# Patient Record
Sex: Female | Born: 1980 | Race: Black or African American | Hispanic: No | Marital: Single | Smoking: Former smoker
Health system: Southern US, Community
[De-identification: ages and names within clinical notes are randomized; demographics above are authoritative.]

## PROBLEM LIST (undated history)

## (undated) DIAGNOSIS — I10 Essential (primary) hypertension: Secondary | ICD-10-CM

## (undated) DIAGNOSIS — Z8742 Personal history of other diseases of the female genital tract: Secondary | ICD-10-CM

## (undated) DIAGNOSIS — Z9884 Bariatric surgery status: Secondary | ICD-10-CM

## (undated) DIAGNOSIS — Z8639 Personal history of other endocrine, nutritional and metabolic disease: Secondary | ICD-10-CM

## (undated) DIAGNOSIS — E669 Obesity, unspecified: Secondary | ICD-10-CM

## (undated) DIAGNOSIS — K859 Acute pancreatitis without necrosis or infection, unspecified: Secondary | ICD-10-CM

## (undated) HISTORY — PX: ROUX-EN-Y GASTRIC BYPASS: SHX1104

## (undated) HISTORY — DX: Obesity, unspecified: E66.9

## (undated) HISTORY — DX: Bariatric surgery status: Z98.84

## (undated) HISTORY — DX: Personal history of other diseases of the female genital tract: Z87.42

## (undated) HISTORY — PX: CERVICAL CONE BIOPSY: SUR198

## (undated) HISTORY — DX: Essential (primary) hypertension: I10

## (undated) HISTORY — DX: Personal history of other endocrine, nutritional and metabolic disease: Z86.39

---

## 2018-09-19 ENCOUNTER — Telehealth: Payer: Self-pay | Admitting: General Surgery

## 2018-09-19 NOTE — Telephone Encounter (Signed)
Left a voicemail for the patient to contact the office to pre-screen for virtual visit 09/20/2018

## 2018-09-19 NOTE — Progress Notes (Signed)
TELEHEALTH VISIT  Referring Provider: Wallis BambergMani, Mario, PA-C Primary Care Physician:  No primary care provider on file.   Tele-visit due to COVID-19 pandemic Patient requested visit virtually, consented to the virtual encounter via video enabled telemedicine application (Doximity) Contact made at: 10:00 09/20/18 Patient verified by name and date of birth Location of patient: Home Location provider: [place] [facility] Names of persons participating: Me, patient Time spent on telehealth visit: 26 minutes I discussed the limitations of evaluation and management by telemedicine. The patient expressed understanding and agreed to proceed.  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 Both upper (such as marginal ulcers following Roux-en-Y, gastritis, peptic ulcer disease, and esophagitis given her recent atypical chest pain) and lower sources (outlet sources such as fissure or hemorrhoids, as well as polyps, mass, ulcers, and colitis). Given this differential I am recommending a colonoscopy and upper endoscopy.  She has not been taking any of her post Roux-en-Y recommended supplements.  It is common for patients after a Roux-en-Y to need iron, calcium, B12, folate as well as copper, thiamine, and zinc.  I also recommend use of vitamin A, vitamin D, vitamin K, and regular calcium.  However, my best recommendation for Laura Hunter would be to review the specific recommendations from her bariatric surgery team at [facility].   PLAN: Continue famotidine Abstinence from alcohol recommended Resume micronutrient supplements as recommended by the bariatric surgeon EGD and colonoscopy recommended, but she would only like to  proceed with colonoscopy at this time  I consented the patient discussing the risks, benefits, and alternatives to endoscopic evaluation. In particular, we discussed the risks that include, but are not limited to, reaction to medication, cardiopulmonary compromise, bleeding requiring blood transfusion, aspiration resulting in pneumonia, perforation requiring surgery, lack of diagnosis, severe illness requiring hospitalization, and even death. We reviewed the risk of missed lesion including polyps or even cancer. The patient acknowledges these risks and asks that we proceed.  HPI: Laura Hunter is a 38 y.o. [employer] customer [employer] referred byPA Wallis BambergMario Mani for blood in the stool. She has a history of hypertension, abnormal Pap smear in 2011, diabetes mellitus improved after [facility]ariatric surgery in 2012 [facility], low back pain, hyperlipidemia. The history is obtained through the patient and review of her electronic health record including CareEverywhere. Seen by [facility] 09/04/18 which prompted this visit.   Roux-en-Y at bariatric surgery 2012. Takes a woman's daily with iron and occasionally biotin.   Initially presented to the Urgent Care for evaluation of atypical, constant, sharp, chest pain/pressure over the midchest that she thinks may be due to indigestion.  No heartburn, dysphagia, odynophagia, or dysphonia.  Also noted to the PA during the time of her visit that she was having back pain, had been off her hypertension medications, and blood in the stool. Noted drinking at least 4-5 shots of liquor daily. PA Mani recommended that she see GI.  She has plans to return there for follow-up and labs tomorrow. Unfortunately, they were unable to draw labs at the time of her prior visit.  Given Pepcid. Abstinence from alcohol recommended.  The pharmacist recommended OTC Pepcid when she went to have this filled.  The box noted that it was to be used as needed  and she is only used it once or twice since  her visit in the urgent care.  Sees blood in the stool, on the paper, and in the toilet bowl for a few days every couple of months. Primarily occurs after a binge of drinking over the last 5-6 months.  Normal bowel habits between those episodes.  No melena. No abdominal pain, nausea, or vomiting. No rectal pain.  No recent change in bowel habits.  No diarrhea or constipation.    No prior endoscopic evaluation.  No prior abdominal imaging.  No known family history of colon cancer or polyps. No family history of uterine/endometrial cancer, pancreatic cancer or gastric/stomach cancer.  Past Medical History:  Diagnosis Date   Obesity     Past Surgical History:  Procedure Laterality Date   CESAREAN SECTION     ROUX-EN-Y GASTRIC BYPASS      Current Outpatient Medications  Medication Sig Dispense Refill   amLODipine (NORVASC) 5 MG tablet TK 1 T PO  D     cyclobenzaprine (FLEXERIL) 5 MG tablet TK 1 T PO  TID PRN     famotidine (PEPCID) 20 MG tablet famotidine 20 mg tablet  Take 1 tablet twice a day by oral route.     hydrochlorothiazide (HYDRODIURIL) 12.5 MG tablet TK 1 T PO  D     No current facility-administered medications for this visit.     Allergies as of 09/20/2018 - Review Complete 09/20/2018  Allergen Reaction Noted   Lisinopril Other (See Comments) 03/08/2013    Family History  Problem Relation Age of Onset   HIV/AIDS Mother    HIV/AIDS Father    Diabetes Brother     Social History   Socioeconomic History   Marital status: Not on file    Spouse name: Not on file   Number of children: Not on file   Years of education: Not on file   Highest education level: Not on file  Occupational History   Not on file  Social Needs   Financial resource strain: Not on file   Food insecurity    Worry: Not on file    Inability: Not on file   Transportation needs    Medical: Not on file    Non-medical: Not on file  Tobacco Use   Smoking status: Former  Smoker   Smokeless tobacco: Never Used  Substance and Sexual Activity   Alcohol use: Yes    Alcohol/week: 6.0 standard drinks    Types: 6 Shots of liquor per week    Comment: 3 to 6 shots daily of Bacardi   Drug use: Never   Sexual activity: Yes    Partners: Male    Birth control/protection: I.U.D.  Lifestyle   Physical activity    Days per week: Not on file    Minutes per session: Not on file   Stress: Not on file  Relationships   Social connections    Talks on phone: Not on file    Gets together: Not on file    Attends religious service: Not on file    Active member of club or organization: Not on file    Attends meetings of clubs or organizations: Not on file    Relationship status: Not on file   Intimate partner violence    Fear of current or ex partner: Not on file    Emotionally abused: Not on file    Physically abused: Not on file    Forced sexual activity: Not on  file  Other Topics Concern   Not on file  Social History Narrative   Not on file    Review of Systems: ALL ROS discussed and all others negative except listed in HPI.  Physical Exam: Complete physical exam not performed due to the limits inherent in a telehealth encounter.  General: Awake, alert, and oriented, and well communicative. In no acute distress.  HEENT: EOMI, non-icteric sclera, NCAT, MMM  Neck: Normal movement of head and neck  Pulm: No labored breathing, speaking in full sentences without conversational dyspnea  Derm: No apparent lesions or bruising in visible field  MS: Moves all visible extremities without noticeable abnormality  Psych: Pleasant, cooperative, normal speech, normal affect and normal insight Neuro: Alert and appropriate   Ahmed Inniss L. Orvan FalconerBeavers, MD, MPH [facility] 09/20/2018, 10:13 AM

## 2018-09-20 ENCOUNTER — Encounter: Payer: Self-pay | Admitting: Gastroenterology

## 2018-09-20 ENCOUNTER — Telehealth: Payer: Self-pay | Admitting: General Surgery

## 2018-09-20 ENCOUNTER — Ambulatory Visit (INDEPENDENT_AMBULATORY_CARE_PROVIDER_SITE_OTHER): Payer: BLUE CROSS/BLUE SHIELD | Admitting: Gastroenterology

## 2018-09-20 VITALS — Ht 62.0 in | Wt 205.0 lb

## 2018-09-20 DIAGNOSIS — K921 Melena: Secondary | ICD-10-CM

## 2018-09-20 MED ORDER — NA SULFATE-K SULFATE-MG SULF 17.5-3.13-1.6 GM/177ML PO SOLN: 1.0000 | Freq: Once | ORAL | 0 refills | Status: AC

## 2018-09-20 NOTE — Patient Instructions (Addendum)
Recommended a colonoscopy and upper endoscopy for further evaluation of the blood in your stool.  At your request we will proceed with the colonoscopy first.  However, if the colonoscopy is negative and upper endoscopy will be needed.  An upper endoscopy also allows [place] to evaluate your recent chest pain.  You have been scheduled for a colonoscopy. Please follow written instructions given to you at your visit today.  Please pick up your prep supplies at the pharmacy within the next 1-3 days.  If you use inhalers (even only as needed), please bring them with you on the day of your procedure.   Please resume all supplements as recommended by your bariatric surgery team.  You mentioned you will have blood drawn through the urgent care later this week.  Please asked them to send [place] a copy of those results when they are available.  Abstinence from alcohol will be important for your long-term health.  Alcohol may be contributing to your GI bleeding.  Thank you for your patience with me and our technology today! Please stay home, safe, and healthy. I look forward to meeting you in person in the future.

## 2018-09-20 NOTE — Telephone Encounter (Signed)
Tried to contact patient to pre-screen for virtual visit. Phone went straight to voicemail left a message

## 2018-10-26 ENCOUNTER — Telehealth: Payer: Self-pay | Admitting: Gastroenterology

## 2018-10-26 NOTE — Telephone Encounter (Signed)
Noted! Thank you

## 2018-10-26 NOTE — Telephone Encounter (Signed)
Pt canceled her appt from Monday until 12/07/2018 because she is having plumbing problems and problems with her house

## 2018-10-29 ENCOUNTER — Encounter: Payer: BLUE CROSS/BLUE SHIELD | Admitting: Gastroenterology

## 2018-12-06 ENCOUNTER — Telehealth: Payer: Self-pay

## 2018-12-06 ENCOUNTER — Encounter: Payer: Self-pay | Admitting: Gastroenterology

## 2018-12-06 NOTE — Telephone Encounter (Signed)
Good morning Dr. Tarri Glenn I was calling this patient for covid screening, when she informed me she had cancelled the procedure. Looks like she r/s back in August, but states she was calling to cancel not reschedule. Would you like to charge for late cancellation?

## 2018-12-06 NOTE — Telephone Encounter (Signed)
Patient states she has not had any more rectal bleeding since she scheduled her procedures and she feels fine. She declined to rescheduled. Will call back to follow up as needed.

## 2018-12-06 NOTE — Telephone Encounter (Signed)
I strongly recommend colonoscopy even though she has had not further bleeding. There is the risk of not diagnosis colon polyp or colon cancer as early as possible. Thank you.

## 2018-12-06 NOTE — Telephone Encounter (Signed)
Desiree, please call the patient. I am very concerned about her not having a colonoscopy because of her rectal bleeding. Please find out why she doesn't wish to proceed and offer her a follow-up office visit. Thank you.

## 2018-12-07 ENCOUNTER — Encounter: Payer: BLUE CROSS/BLUE SHIELD | Admitting: Gastroenterology

## 2018-12-10 NOTE — Telephone Encounter (Signed)
Spoke with patient, she thanks you for your concern. She states it is very hard to get time off at her job but she will check her schedule and call back within the next week.

## 2019-04-26 ENCOUNTER — Emergency Department (HOSPITAL_COMMUNITY): Payer: BLUE CROSS/BLUE SHIELD

## 2019-04-26 ENCOUNTER — Other Ambulatory Visit: Payer: Self-pay

## 2019-04-26 ENCOUNTER — Inpatient Hospital Stay (HOSPITAL_COMMUNITY)
Admission: EM | Admit: 2019-04-26 | Discharge: 2019-04-30 | DRG: 439 | Disposition: A | Discharge status: Home / Self Care | Payer: BLUE CROSS/BLUE SHIELD | Attending: Internal Medicine | Admitting: Internal Medicine

## 2019-04-26 ENCOUNTER — Encounter (HOSPITAL_COMMUNITY): Payer: Self-pay | Admitting: *Deleted

## 2019-04-26 DIAGNOSIS — Z20822 Contact with and (suspected) exposure to covid-19: Secondary | ICD-10-CM | POA: Diagnosis present

## 2019-04-26 DIAGNOSIS — D649 Anemia, unspecified: Secondary | ICD-10-CM | POA: Diagnosis not present

## 2019-04-26 DIAGNOSIS — D61818 Other pancytopenia: Secondary | ICD-10-CM | POA: Diagnosis present

## 2019-04-26 DIAGNOSIS — D520 Dietary folate deficiency anemia: Secondary | ICD-10-CM | POA: Diagnosis not present

## 2019-04-26 DIAGNOSIS — Z9884 Bariatric surgery status: Secondary | ICD-10-CM | POA: Diagnosis not present

## 2019-04-26 DIAGNOSIS — Z888 Allergy status to other drugs, medicaments and biological substances status: Secondary | ICD-10-CM | POA: Diagnosis not present

## 2019-04-26 DIAGNOSIS — Z8719 Personal history of other diseases of the digestive system: Secondary | ICD-10-CM | POA: Diagnosis not present

## 2019-04-26 DIAGNOSIS — I10 Essential (primary) hypertension: Secondary | ICD-10-CM | POA: Diagnosis present

## 2019-04-26 DIAGNOSIS — F1010 Alcohol abuse, uncomplicated: Secondary | ICD-10-CM | POA: Diagnosis present

## 2019-04-26 DIAGNOSIS — N920 Excessive and frequent menstruation with regular cycle: Secondary | ICD-10-CM | POA: Diagnosis present

## 2019-04-26 DIAGNOSIS — K921 Melena: Secondary | ICD-10-CM

## 2019-04-26 DIAGNOSIS — D513 Other dietary vitamin B12 deficiency anemia: Secondary | ICD-10-CM | POA: Diagnosis not present

## 2019-04-26 DIAGNOSIS — E876 Hypokalemia: Secondary | ICD-10-CM | POA: Diagnosis present

## 2019-04-26 DIAGNOSIS — Y762 Prosthetic and other implants, materials and accessory obstetric and gynecological devices associated with adverse incidents: Secondary | ICD-10-CM | POA: Diagnosis present

## 2019-04-26 DIAGNOSIS — Z7289 Other problems related to lifestyle: Secondary | ICD-10-CM | POA: Diagnosis not present

## 2019-04-26 DIAGNOSIS — I82890 Acute embolism and thrombosis of other specified veins: Secondary | ICD-10-CM | POA: Diagnosis present

## 2019-04-26 DIAGNOSIS — Z6837 Body mass index (BMI) 37.0-37.9, adult: Secondary | ICD-10-CM

## 2019-04-26 DIAGNOSIS — E669 Obesity, unspecified: Secondary | ICD-10-CM | POA: Diagnosis present

## 2019-04-26 DIAGNOSIS — K859 Acute pancreatitis without necrosis or infection, unspecified: Secondary | ICD-10-CM

## 2019-04-26 DIAGNOSIS — K760 Fatty (change of) liver, not elsewhere classified: Secondary | ICD-10-CM | POA: Diagnosis present

## 2019-04-26 DIAGNOSIS — K8500 Idiopathic acute pancreatitis without necrosis or infection: Secondary | ICD-10-CM | POA: Diagnosis not present

## 2019-04-26 DIAGNOSIS — Z79899 Other long term (current) drug therapy: Secondary | ICD-10-CM | POA: Diagnosis not present

## 2019-04-26 DIAGNOSIS — T8332XA Displacement of intrauterine contraceptive device, initial encounter: Secondary | ICD-10-CM | POA: Diagnosis present

## 2019-04-26 DIAGNOSIS — Z87891 Personal history of nicotine dependence: Secondary | ICD-10-CM

## 2019-04-26 DIAGNOSIS — K640 First degree hemorrhoids: Secondary | ICD-10-CM | POA: Diagnosis present

## 2019-04-26 DIAGNOSIS — D500 Iron deficiency anemia secondary to blood loss (chronic): Secondary | ICD-10-CM | POA: Diagnosis not present

## 2019-04-26 DIAGNOSIS — R7989 Other specified abnormal findings of blood chemistry: Secondary | ICD-10-CM

## 2019-04-26 DIAGNOSIS — K852 Alcohol induced acute pancreatitis without necrosis or infection: Secondary | ICD-10-CM | POA: Diagnosis present

## 2019-04-26 DIAGNOSIS — D509 Iron deficiency anemia, unspecified: Secondary | ICD-10-CM

## 2019-04-26 LAB — URINALYSIS, ROUTINE W REFLEX MICROSCOPIC
Bilirubin Urine: NEGATIVE
Glucose, UA: NEGATIVE mg/dL
Ketones, ur: NEGATIVE mg/dL
Leukocytes,Ua: NEGATIVE
Nitrite: NEGATIVE
Protein, ur: NEGATIVE mg/dL
Specific Gravity, Urine: 1.011 (ref 1.005–1.030)
pH: 6 (ref 5.0–8.0)

## 2019-04-26 LAB — RETICULOCYTES
Immature Retic Fract: 9.1 % (ref 2.3–15.9)
RBC.: 3.35 MIL/uL — ABNORMAL LOW (ref 3.87–5.11)
Retic Count, Absolute: 28.5 10*3/uL (ref 19.0–186.0)
Retic Ct Pct: 0.9 % (ref 0.4–3.1)

## 2019-04-26 LAB — FERRITIN: Ferritin: 23 ng/mL (ref 11–307)

## 2019-04-26 LAB — CBC
HCT: 24.7 % — ABNORMAL LOW (ref 36.0–46.0)
Hemoglobin: 6.7 g/dL — CL (ref 12.0–15.0)
MCH: 19.2 pg — ABNORMAL LOW (ref 26.0–34.0)
MCHC: 27.1 g/dL — ABNORMAL LOW (ref 30.0–36.0)
MCV: 70.8 fL — ABNORMAL LOW (ref 80.0–100.0)
Platelets: 218 10*3/uL (ref 150–400)
RBC: 3.49 MIL/uL — ABNORMAL LOW (ref 3.87–5.11)
RDW: 32.1 % — ABNORMAL HIGH (ref 11.5–15.5)
WBC: 4.4 10*3/uL (ref 4.0–10.5)
nRBC: 0.7 % — ABNORMAL HIGH (ref 0.0–0.2)

## 2019-04-26 LAB — DIFFERENTIAL
Abs Immature Granulocytes: 0.04 10*3/uL (ref 0.00–0.07)
Basophils Absolute: 0 10*3/uL (ref 0.0–0.1)
Basophils Relative: 1 %
Eosinophils Absolute: 0 10*3/uL (ref 0.0–0.5)
Eosinophils Relative: 1 %
Immature Granulocytes: 1 %
Lymphocytes Relative: 26 %
Lymphs Abs: 1 10*3/uL (ref 0.7–4.0)
Monocytes Absolute: 0.1 10*3/uL (ref 0.1–1.0)
Monocytes Relative: 2 %
Neutro Abs: 2.7 10*3/uL (ref 1.7–7.7)
Neutrophils Relative %: 69 %

## 2019-04-26 LAB — BASIC METABOLIC PANEL
Anion gap: 15 (ref 5–15)
BUN: <5 mg/dL — ABNORMAL LOW (ref 6–20)
CO2: 24 mmol/L (ref 22–32)
Calcium: 7.8 mg/dL — ABNORMAL LOW (ref 8.9–10.3)
Chloride: 99 mmol/L (ref 98–111)
Creatinine, Ser: 0.61 mg/dL (ref 0.44–1.00)
GFR calc Af Amer: >60 mL/min (ref >60)
GFR calc non Af Amer: >60 mL/min (ref >60)
Glucose, Bld: 113 mg/dL — ABNORMAL HIGH (ref 70–99)
Potassium: 2.5 mmol/L — CL (ref 3.5–5.1)
Sodium: 138 mmol/L (ref 135–145)

## 2019-04-26 LAB — PREPARE RBC (CROSSMATCH)

## 2019-04-26 LAB — HEPATIC FUNCTION PANEL
ALT: 46 U/L — ABNORMAL HIGH (ref 0–44)
AST: 138 U/L — ABNORMAL HIGH (ref 15–41)
Albumin: 3.6 g/dL (ref 3.5–5.0)
Alkaline Phosphatase: 72 U/L (ref 38–126)
Bilirubin, Direct: 0.3 mg/dL — ABNORMAL HIGH (ref 0.0–0.2)
Indirect Bilirubin: 0.9 mg/dL (ref 0.3–0.9)
Total Bilirubin: 1.2 mg/dL (ref 0.3–1.2)
Total Protein: 7 g/dL (ref 6.5–8.1)

## 2019-04-26 LAB — POC OCCULT BLOOD, ED: Fecal Occult Bld: NEGATIVE

## 2019-04-26 LAB — IRON AND TIBC
Iron: 196 ug/dL — ABNORMAL HIGH (ref 28–170)
Saturation Ratios: 47 % — ABNORMAL HIGH (ref 10.4–31.8)
TIBC: 421 ug/dL (ref 250–450)
UIBC: 225 ug/dL

## 2019-04-26 LAB — LIPASE, BLOOD: Lipase: 157 U/L — ABNORMAL HIGH (ref 11–51)

## 2019-04-26 LAB — HIV ANTIBODY (ROUTINE TESTING W REFLEX): HIV Screen 4th Generation wRfx: NONREACTIVE

## 2019-04-26 LAB — ABO/RH: ABO/RH(D): AB POS

## 2019-04-26 LAB — CBG MONITORING, ED: Glucose-Capillary: 114 mg/dL — ABNORMAL HIGH (ref 70–99)

## 2019-04-26 LAB — VITAMIN B12: Vitamin B-12: 130 pg/mL — ABNORMAL LOW (ref 180–914)

## 2019-04-26 LAB — I-STAT BETA HCG BLOOD, ED (MC, WL, AP ONLY): I-stat hCG, quantitative: <5 m[IU]/mL (ref <5)

## 2019-04-26 LAB — MAGNESIUM: Magnesium: 1.1 mg/dL — ABNORMAL LOW (ref 1.7–2.4)

## 2019-04-26 LAB — FOLATE: Folate: 5.5 ng/mL — ABNORMAL LOW (ref >5.9)

## 2019-04-26 MED ORDER — LORAZEPAM 2 MG/ML IJ SOLN
0.5000 mg | Freq: Once | INTRAMUSCULAR | Status: AC
  Administered 2019-04-26: 0.5 mg via INTRAVENOUS
  Filled 2019-04-26: qty 1

## 2019-04-26 MED ORDER — AMLODIPINE BESYLATE 5 MG PO TABS
5.0000 mg | ORAL_TABLET | Freq: Every day | ORAL | Status: DC
  Administered 2019-04-26 – 2019-04-30 (×5): 5 mg via ORAL
  Filled 2019-04-26 (×5): qty 1

## 2019-04-26 MED ORDER — ONDANSETRON HCL 4 MG PO TABS: 4.0000 mg | ORAL_TABLET | Freq: Four times a day (QID) | ORAL | Status: DC | PRN

## 2019-04-26 MED ORDER — SODIUM CHLORIDE 0.9 % IV SOLN: INTRAVENOUS | Status: DC

## 2019-04-26 MED ORDER — MAGNESIUM SULFATE 2 GM/50ML IV SOLN
2.0000 g | Freq: Once | INTRAVENOUS | Status: AC
  Administered 2019-04-26: 18:00:00 2 g via INTRAVENOUS
  Filled 2019-04-26: qty 50

## 2019-04-26 MED ORDER — LABETALOL HCL 5 MG/ML IV SOLN
10.0000 mg | Freq: Once | INTRAVENOUS | Status: AC
  Administered 2019-04-26: 10 mg via INTRAVENOUS
  Filled 2019-04-26: qty 4

## 2019-04-26 MED ORDER — ACETAMINOPHEN 650 MG RE SUPP: 650.0000 mg | Freq: Four times a day (QID) | RECTAL | Status: DC | PRN

## 2019-04-26 MED ORDER — MORPHINE SULFATE (PF) 4 MG/ML IV SOLN
4.0000 mg | Freq: Once | INTRAVENOUS | Status: AC
  Administered 2019-04-26: 4 mg via INTRAVENOUS
  Filled 2019-04-26: qty 1

## 2019-04-26 MED ORDER — POTASSIUM CHLORIDE 10 MEQ/100ML IV SOLN
10.0000 meq | INTRAVENOUS | Status: AC
  Administered 2019-04-26 (×3): 10 meq via INTRAVENOUS
  Filled 2019-04-26 (×3): qty 100

## 2019-04-26 MED ORDER — ONDANSETRON HCL 4 MG/2ML IJ SOLN
4.0000 mg | Freq: Four times a day (QID) | INTRAMUSCULAR | Status: DC | PRN
  Administered 2019-04-27 – 2019-04-28 (×3): 4 mg via INTRAVENOUS
  Filled 2019-04-26 (×3): qty 2

## 2019-04-26 MED ORDER — IOHEXOL 300 MG/ML  SOLN
100.0000 mL | Freq: Once | INTRAMUSCULAR | Status: AC | PRN
  Administered 2019-04-26: 18:00:00 100 mL via INTRAVENOUS

## 2019-04-26 MED ORDER — PANTOPRAZOLE SODIUM 40 MG IV SOLR
40.0000 mg | Freq: Once | INTRAVENOUS | Status: AC
  Administered 2019-04-26: 40 mg via INTRAVENOUS
  Filled 2019-04-26: qty 40

## 2019-04-26 MED ORDER — CYCLOBENZAPRINE HCL 5 MG PO TABS: 5.0000 mg | ORAL_TABLET | Freq: Three times a day (TID) | ORAL | Status: DC | PRN

## 2019-04-26 MED ORDER — POTASSIUM CHLORIDE CRYS ER 20 MEQ PO TBCR
40.0000 meq | EXTENDED_RELEASE_TABLET | Freq: Once | ORAL | Status: AC
  Administered 2019-04-26: 21:00:00 40 meq via ORAL
  Filled 2019-04-26: qty 2

## 2019-04-26 MED ORDER — SODIUM CHLORIDE 0.9% FLUSH
3.0000 mL | Freq: Once | INTRAVENOUS | Status: AC
  Administered 2019-04-26: 3 mL via INTRAVENOUS

## 2019-04-26 MED ORDER — MORPHINE SULFATE (PF) 2 MG/ML IV SOLN
2.0000 mg | INTRAVENOUS | Status: DC | PRN
  Administered 2019-04-26 – 2019-04-28 (×5): 2 mg via INTRAVENOUS
  Administered 2019-04-28: 4 mg via INTRAVENOUS
  Administered 2019-04-29: 2 mg via INTRAVENOUS
  Filled 2019-04-26 (×2): qty 2
  Filled 2019-04-26 (×4): qty 1
  Filled 2019-04-26: qty 2

## 2019-04-26 MED ORDER — SODIUM CHLORIDE 0.9 % IV SOLN
10.0000 mL/h | Freq: Once | INTRAVENOUS | Status: AC
  Administered 2019-04-26: 19:00:00 10 mL/h via INTRAVENOUS

## 2019-04-26 MED ORDER — ACETAMINOPHEN 325 MG PO TABS
650.0000 mg | ORAL_TABLET | Freq: Four times a day (QID) | ORAL | Status: DC | PRN
  Administered 2019-04-27: 650 mg via ORAL
  Filled 2019-04-26: qty 2

## 2019-04-26 NOTE — ED Notes (Signed)
Charge aware of Hgb will got back to next available room.

## 2019-04-26 NOTE — ED Triage Notes (Signed)
C/o dizziness and sob onset 3 days ago, c/o occ. Abd. Pain, episode of vomiting this am. C/o hands cramping up.

## 2019-04-26 NOTE — ED Notes (Signed)
Pt transported to CT ?

## 2019-04-26 NOTE — H&P (Addendum)
History and Physical    Laura Hunter GUY:[id number] DOB: October 04, 1980 DOA: 04/26/2019  PCP: Patient, No Pcp Per  Patient coming from: Home  I have personally briefly reviewed patient's old medical records in [place]  Chief Complaint: Dizziness, abd pain  HPI: Laura Hunter is a 39 y.o. female with medical history significant of HTN, obesity s/p Roux-en-y 2012.  Pt presents with acute onset, progressively worsening upper abd pain for past 3 weeks.   She notes generalized weakness and has had dyspnea on exertion as well as lightheadedness with any exertion or activity for the last week or so.  No syncope.  Earlier today at around 5 AM she took a sip of water and threw it up.  She has not had anything to eat or drink since then.  No melena or hematochezia recently but states that she had a remote history of melanotic stools 6 months ago for which she was supposed to get a colonoscopy but never did.  She has been taking Aleve and Flexeril for her symptoms.  She is a former smoker, drinks 6 rum shots a few times a week.   ED Course: Lipase 157, AST 138, alt 48, K 2.5, HGB 6.7.  Hemoccult neg today.  CT reveals: 1. Mild acute edematous pancreatitis. 2. 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 3. Malpositioned IUD which is low and rotated with 1 of the side arms penetrating the lower uterine segment. 4. Hepatic steatosis.  Review of Systems: As per HPI, otherwise all review of systems negative.  Past Medical History:  Diagnosis Date  . Hypertension   . Obesity     Past Surgical History:  Procedure Laterality Date  . CESAREAN SECTION    . ROUX-EN-Y GASTRIC BYPASS       reports that she has quit smoking. She has never used smokeless tobacco. She reports current alcohol use of about 6.0 standard drinks of alcohol per week. She reports that she does not use drugs.  Allergies  Allergen Reactions  . Lisinopril Cough    Family History  Problem Relation Age  of Onset  . HIV/AIDS Mother   . HIV/AIDS Father   . Diabetes Brother      Prior to Admission medications   Medication Sig Start Date End Date Taking? Authorizing Provider  amLODipine (NORVASC) 5 MG tablet Take 5 mg by mouth daily.  09/04/18  Yes [provider]  cyclobenzaprine (FLEXERIL) 5 MG tablet Take 5 mg by mouth 3 (three) times daily as needed for muscle spasms.  09/04/18  Yes [provider]  hydrochlorothiazide (HYDRODIURIL) 12.5 MG tablet Take 12.5 mg by mouth daily.  09/04/18  Yes [provider]  naproxen sodium (ALEVE) 220 MG tablet Take 220 mg by mouth as needed (pain).   Yes [provider]    Physical Exam: Vitals:   04/26/19 1830 04/26/19 1845 04/26/19 1850 04/26/19 2041  BP: (!) 144/85 (!) 148/98 (!) 151/92 (!) 150/91  Pulse: 99 94 88 94  Resp: 19 (!) 22 14 (!) 21  Temp: 99.7 F (37.6 C)  99.1 F (37.3 C) 99.7 F (37.6 C)  TempSrc: Oral  Oral Oral  SpO2:  99%  100%  Weight:      Height:        Constitutional: NAD, calm, comfortable Eyes: PERRL, lids and conjunctivae normal ENMT: Mucous membranes are moist. Posterior pharynx clear of any exudate or lesions.Normal dentition.  Neck: normal, supple, no masses, no thyromegaly Respiratory: clear to auscultation bilaterally,  no wheezing, no crackles. Normal respiratory effort. No accessory muscle use.  Cardiovascular: Regular rate and rhythm, no murmurs / rubs / gallops. No extremity edema. 2+ pedal pulses. No carotid bruits.  Abdomen: no tenderness, no masses palpated. No hepatosplenomegaly. Bowel sounds positive.  Musculoskeletal: no clubbing / cyanosis. No joint deformity upper and lower extremities. Good ROM, no contractures. Normal muscle tone.  Skin: no rashes, lesions, ulcers. No induration Neurologic: CN 2-12 grossly intact. Sensation intact, DTR normal. Strength 5/5 in all 4.  Psychiatric: Normal judgment and insight. Alert and oriented x 3. Normal mood.    Labs on  Admission: I have personally reviewed following labs and imaging studies  CBC: Recent Labs  Lab 04/26/19 1333 04/26/19 1545  WBC 4.4  --   NEUTROABS  --  2.7  HGB 6.7*  --   HCT 24.7*  --   MCV 70.8*  --   PLT 218  --    Basic Metabolic Panel: Recent Labs  Lab 04/26/19 1333 04/26/19 1545  NA 138  --   K 2.5*  --   CL 99  --   CO2 24  --   GLUCOSE 113*  --   BUN <5*  --   CREATININE 0.61  --   CALCIUM 7.8*  --   MG  --  1.1*   GFR: Estimated Creatinine Clearance: 96.7 mL/min (by C-G formula based on SCr of 0.61 mg/dL). Liver Function Tests: Recent Labs  Lab 04/26/19 1545  AST 138*  ALT 46*  ALKPHOS 72  BILITOT 1.2  PROT 7.0  ALBUMIN 3.6   Recent Labs  Lab 04/26/19 1545  LIPASE 157*   No results for input(s): AMMONIA in the last 168 hours. Coagulation Profile: No results for input(s): INR, PROTIME in the last 168 hours. Cardiac Enzymes: No results for input(s): CKTOTAL, CKMB, CKMBINDEX, TROPONINI in the last 168 hours. BNP (last 3 results) No results for input(s): PROBNP in the last 8760 hours. HbA1C: No results for input(s): HGBA1C in the last 72 hours. CBG: Recent Labs  Lab 04/26/19 1508  GLUCAP 114*   Lipid Profile: No results for input(s): CHOL, HDL, LDLCALC, TRIG, CHOLHDL, LDLDIRECT in the last 72 hours. Thyroid Function Tests: No results for input(s): TSH, T4TOTAL, FREET4, T3FREE, THYROIDAB in the last 72 hours. Anemia Panel: Recent Labs    04/26/19 1545  VITAMINB12 130*  FOLATE 5.5*  FERRITIN 23  TIBC 421  IRON 196*  RETICCTPCT 0.9   Urine analysis:    Component Value Date/Time   COLORURINE YELLOW 04/26/2019 1510   APPEARANCEUR CLEAR 04/26/2019 1510   LABSPEC 1.011 04/26/2019 1510   PHURINE 6.0 04/26/2019 1510   GLUCOSEU NEGATIVE 04/26/2019 1510   HGBUR SMALL (A) 04/26/2019 1510   BILIRUBINUR NEGATIVE 04/26/2019 1510   KETONESUR NEGATIVE 04/26/2019 1510   PROTEINUR NEGATIVE 04/26/2019 1510   NITRITE NEGATIVE 04/26/2019 1510    LEUKOCYTESUR NEGATIVE 04/26/2019 1510    Radiological Exams on Admission: CT ABDOMEN PELVIS W CONTRAST  Result Date: 04/26/2019 CLINICAL DATA:  Cholecystitis.  Dizziness and shortness of breath. EXAM: CT ABDOMEN AND PELVIS WITH CONTRAST TECHNIQUE: Multidetector CT imaging of the abdomen and pelvis was performed using the standard protocol following bolus administration of intravenous contrast. CONTRAST:  OMNIPAQUE IOHEXOL 300 MG/ML  SOLN COMPARISON:  None. FINDINGS: Lower chest:  No contributory findings. Hepatobiliary: Hepatic steatosis.No evidence of biliary obstruction or stone. Pancreas: Fat stranding around the head and uncinate process tracking into the mesentery. There is correlative elevated lipase. No  ductal dilatation or collection. Spleen: Unremarkable. Adrenals/Urinary Tract: Negative adrenals. No hydronephrosis or stone. Unremarkable bladder. Stomach/Bowel:  No obstruction. No appendicitis.  Gastric bypass. Vascular/Lymphatic: Mild atherosclerotic plaque. Venous findings noted below. No mass or adenopathy. Reproductive:Malpositioned IUD which is rotated with the side-arm through the lower uterine segment that approaches the serosa. The stem is at the level of the uterine and cervical junction. Nonobstructive filling defect the level of the mid right ovarian vein, see coronal reformats, with some regional fat stranding. The ovaries themselves appear symmetric. Other: No ascites or pneumoperitoneum. Musculoskeletal: No acute abnormalities.  Spondylosis. IMPRESSION: 1. Mild acute edematous pancreatitis. 2. 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 3. Malpositioned IUD which is low and rotated with 1 of the side arms penetrating the lower uterine segment. 4. Hepatic steatosis. 5. Gastric bypass. Electronically Signed   By: Marnee Spring M.D.   On: 04/26/2019 18:55   DG Abd Portable 1 View  Result Date: 04/26/2019 CLINICAL DATA:  Abdominal pain.  Emesis. EXAM: PORTABLE ABDOMEN - 1 VIEW  COMPARISON:  None FINDINGS: Mid abdominal small bowel loop measures up to 3.4 cm. Right hemidiaphragm elevation. No free intraperitoneal air. Surgical sutures in the region of the stomach, consistent with Roux-en-Y gastric bypass. No abnormal abdominal calcifications. No appendicolith. Pelvic calcifications are most likely phleboliths. Intrauterine device. IMPRESSION: Nonspecific loop of borderline dilated mid abdominal small bowel. No specific evidence of bowel obstruction and no evidence of free intraperitoneal air. Electronically Signed   By: Jeronimo Greaves M.D.   On: 04/26/2019 16:29    EKG: Independently reviewed.  Assessment/Plan Principal Problem:   Acute alcoholic pancreatitis Active Problems:   Anemia due to GI blood loss   Malpositioned IUD   Thrombosis of ovarian vein   Hypokalemia    1. Acute alcoholic pancreatitis - 1. Pancreatitis probably due to binge drinking 2. No evidence of biliary obstruction, stones on CT 3. NPO 4. IVF: NS at 125 cc/hr 5. Morphine PRN pain 6. zofran PRN 7. Repeat labs in AM 8. Monitor LFT elevations, not sure if the transaminitis just chronic EtOH hepatitis vs secondary to pancreatitis 2. 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 Call GI in AM 2. Reviewed office and telephone notes from late last year: 3. Looks like they wanted to do EGD and colonoscopy then. 4. Also advised patient against taking NSAIDS s/p Roux-en-y, patient says she just started these in the past week, and knows shes not supposed to use these. 5. Tele monitor 6. Transfuse PRBC 7. Repeat CBC in AM 3. Hypokalemia - 1. Replace 2. Repeat K at MN and 0500 with CMP 4. Malpositioned IUD and partial thrombosis of ovarian vein - 1. May want to give GYN a call in AM 2. Presumably nothing acute to do at this time and may just end up following up outpt. 3. Dont want to start blood thinners acutely due to chronic GIB and HGB 6.7 today.  DVT prophylaxis: SCDs Code Status: Full Family  Communication: No family in room Disposition Plan: Home after admit Consults called: None Admission status: Admit to inpatient  Severity of Illness: The appropriate patient status for this patient is INPATIENT. Inpatient status is judged to be reasonable and necessary in order to provide the required intensity of service to ensure the patient's safety. The patient's presenting symptoms, physical exam findings, and initial radiographic and laboratory data in the context of their chronic comorbidities is felt to place them at high risk for further clinical deterioration. Furthermore, it is not  anticipated that the patient will be medically stable for discharge from the hospital within 2 midnights of admission. The following factors support the patient status of inpatient.   IP status for: 1) acute alcoholic pancreatitis with transaminitis. 2) anemia with HGB 6.7, likely GI source, GI was recommending endoscopy for last year.  * I certify that at the point of admission it is my clinical judgment that the patient will require inpatient hospital care spanning beyond 2 midnights from the point of admission due to high intensity of service, high risk for further deterioration and high frequency of surveillance required.*    Derryl Uher M. DO [facility]  How to contact the [facility] Attending or Consulting provider 7A - 7P or covering provider during after hours 7P -7A, for this patient?  1. Check the care team in [facility] and look for a) attending/consulting [facility] provider listed and b) the [facility] team listed 2. Log into [email or web address]  Amion Physician Scheduling and messaging for groups and whole hospitals  On call and physician scheduling software for group practices, residents, hospitalists and other medical providers for call, clinic, rotation and shift schedules. OnCall [employer] is a hospital-wide system for scheduling doctors and paging doctors on call. EasyPlot is for scientific plotting and data  analysis.  [email or web address]  and use [facility]'s universal password to access. If you do not have the password, please contact the hospital operator.  3. Locate the [facility] provider you are looking for under [facility] and page to a number that you can be directly reached. 4. If you still have difficulty reaching the provider, please page the [facility] (Director on Call) for the Hospitalists listed on amion for assistance.  04/26/2019, 9:17 PM

## 2019-04-26 NOTE — ED Provider Notes (Signed)
[facility] DEPARTMENT Provider Note   CSN: [id number] Arrival date & time: 04/26/19  1251     History Chief Complaint  Patient presents with  . Dizziness    Laura Hunter is a 39 y.o. female with history of hypertension, obesity, status post gastric bypass surgery in 2012 presents for evaluation of acute onset, progressively worsening upper abdominal pain for 3 weeks.  Pain is gnawing and cramping, radiates to the back.  No aggravating or alleviating factors noted.  She notes generalized weakness and has had dyspnea on exertion as well as lightheadedness with any exertion or activity for the last week or so.  No syncope.  Earlier today at around 5 AM she took a sip of water and threw it up.  She has not had anything to eat or drink since then.  No melena or hematochezia recently but states that she had a remote history of melanotic stools 6 months ago for which she was supposed to get a colonoscopy but never did.  She denies heavy periods.  She has been taking Aleve and Flexeril for her symptoms.  She is a former smoker, drinks 6 rum shots a few times a week.  The history is provided by the patient.  Dizziness Associated symptoms: nausea, shortness of breath and vomiting   Associated symptoms: no chest pain and no diarrhea        Past Medical History:  Diagnosis Date  . Hypertension   . Obesity     Patient Active Problem List   Diagnosis Date Noted  . Acute alcoholic pancreatitis 32/67/1245  . Anemia due to GI blood loss 04/26/2019  . Malpositioned IUD 04/26/2019  . 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 CESAREAN SECTION    . ROUX-EN-Y GASTRIC BYPASS       OB History   No obstetric history on file.     Family History  Problem Relation Age of Onset  . HIV/AIDS Mother   . HIV/AIDS Father   . Diabetes Brother     Social History   Tobacco Use  . Smoking status: Former Research scientist (life sciences)    . Smokeless tobacco: Never Used  Substance Use Topics  . Alcohol use: Yes    Alcohol/week: 6.0 standard drinks    Types: 6 Shots of liquor per week    Comment: 3 to 6 shots daily of Bacardi  . Drug use: Never    Home Medications Prior to Admission medications   Medication Sig Start Date End Date Taking? Authorizing Provider  amLODipine (NORVASC) 5 MG tablet Take 5 mg by mouth daily.  09/04/18  Yes [provider]  cyclobenzaprine (FLEXERIL) 5 MG tablet Take 5 mg by mouth 3 (three) times daily as needed for muscle spasms.  09/04/18  Yes [provider]  hydrochlorothiazide (HYDRODIURIL) 12.5 MG tablet Take 12.5 mg by mouth daily.  09/04/18  Yes [provider]  naproxen sodium (ALEVE) 220 MG tablet Take 220 mg by mouth as needed (pain).   Yes [provider]    Allergies    Lisinopril  Review of Systems   Review of Systems  Constitutional: Positive for fatigue. Negative for chills and fever.  Respiratory: Positive for shortness of breath.   Cardiovascular: Negative for chest pain and leg swelling.  Gastrointestinal: Positive for abdominal pain, nausea and vomiting. Negative for constipation and diarrhea.  Musculoskeletal: Positive for back pain.  Neurological: Positive for light-headedness. Negative for  syncope.  All other systems reviewed and are negative.   Physical Exam Updated Vital Signs BP (!) 151/92   Pulse 88   Temp 99.1 F (37.3 C) (Oral)   Resp 14   Ht 5\' 2"  (1.575 m)   Wt 88.5 kg   SpO2 99%   BMI 35.67 kg/m   Physical Exam Vitals and nursing note reviewed.  Constitutional:      General: She is not in acute distress.    Appearance: She is well-developed.  HENT:     Head: Normocephalic and atraumatic.  Eyes:     General:        Right eye: No discharge.        Left eye: No discharge.     Conjunctiva/sclera: Conjunctivae normal.  Neck:     Vascular: No JVD.     Trachea: No tracheal deviation.  Cardiovascular:      Rate and Rhythm: Regular rhythm. Tachycardia present.  Pulmonary:     Effort: Pulmonary effort is normal.     Breath sounds: Normal breath sounds.  Abdominal:     General: Abdomen is protuberant. Bowel sounds are decreased. There is no distension.     Palpations: Abdomen is soft.     Tenderness: There is abdominal tenderness in the right upper quadrant, epigastric area and left upper quadrant. There is no right CVA tenderness, left CVA tenderness or rebound. Negative signs include Murphy's sign.     Comments: Voluntary guarding noted  Genitourinary:    Comments: Examination performed in the presence of a chaperone.  No frank rectal bleeding.  Scant amount of light brown stool noted in the rectal vault. Skin:    General: Skin is warm and dry.     Findings: No erythema.  Neurological:     Mental Status: She is alert.  Psychiatric:        Behavior: Behavior normal.     ED Results / Procedures / Treatments   Labs (all labs ordered are listed, but only abnormal results are displayed) Labs Reviewed  BASIC METABOLIC PANEL - Abnormal; Notable for the following components:      Result Value   Potassium 2.5 (*)    Glucose, Bld 113 (*)    BUN <5 (*)    Calcium 7.8 (*)    All other components within normal limits  CBC - Abnormal; Notable for the following components:   RBC 3.49 (*)    Hemoglobin 6.7 (*)    HCT 24.7 (*)    MCV 70.8 (*)    MCH 19.2 (*)    MCHC 27.1 (*)    RDW 32.1 (*)    nRBC 0.7 (*)    All other components within normal limits  URINALYSIS, ROUTINE W REFLEX MICROSCOPIC - Abnormal; Notable for the following components:   Hgb urine dipstick SMALL (*)    Bacteria, UA RARE (*)    All other components within normal limits  MAGNESIUM - Abnormal; Notable for the following components:   Magnesium 1.1 (*)    All other components within normal limits  VITAMIN B12 - Abnormal; Notable for the following components:   Vitamin B-12 130 (*)    All other components within normal  limits  FOLATE - Abnormal; Notable for the following components:   Folate 5.5 (*)    All other components within normal limits  IRON AND TIBC - Abnormal; Notable for the following components:   Iron 196 (*)    Saturation Ratios 47 (*)  All other components within normal limits  RETICULOCYTES - Abnormal; Notable for the following components:   RBC. 3.35 (*)    All other components within normal limits  LIPASE, BLOOD - Abnormal; Notable for the following components:   Lipase 157 (*)    All other components within normal limits  HEPATIC FUNCTION PANEL - Abnormal; Notable for the following components:   AST 138 (*)    ALT 46 (*)    Bilirubin, Direct 0.3 (*)    All other components within normal limits  CBG MONITORING, ED - Abnormal; Notable for the following components:   Glucose-Capillary 114 (*)    All other components within normal limits  SARS CORONAVIRUS 2 (TAT 6-24 HRS)  FERRITIN  DIFFERENTIAL  I-STAT BETA HCG BLOOD, ED ([facility], [facility], [facility] ONLY)  POC OCCULT BLOOD, ED  POC OCCULT BLOOD, ED  TYPE AND SCREEN  PREPARE RBC (CROSSMATCH)  ABO/RH    EKG EKG Interpretation  Date/Time:  Friday April 26 2019 16:03:32 EST Ventricular Rate:  93 PR Interval:    QRS Duration: 98 QT Interval:  406 QTC Calculation: 505 R Axis:   62 Text Interpretation: Sinus rhythm Borderline prolonged QT interval No old tracing to compare Confirmed by Pricilla Loveless [phone]) on 04/26/2019 4:06:57 PM   Radiology CT ABDOMEN PELVIS W CONTRAST  Result Date: 04/26/2019 CLINICAL DATA:  Cholecystitis.  Dizziness and shortness of breath. EXAM: CT ABDOMEN AND PELVIS WITH CONTRAST TECHNIQUE: Multidetector CT imaging of the abdomen and pelvis was performed using the standard protocol following bolus administration of intravenous contrast. CONTRAST:  OMNIPAQUE IOHEXOL 300 MG/ML  SOLN COMPARISON:  None. FINDINGS: Lower chest:  No contributory findings. Hepatobiliary: Hepatic steatosis.No evidence of biliary  obstruction or stone. Pancreas: Fat stranding around the head and uncinate process tracking into the mesentery. There is correlative elevated lipase. No ductal dilatation or collection. Spleen: Unremarkable. Adrenals/Urinary Tract: Negative adrenals. No hydronephrosis or stone. Unremarkable bladder. Stomach/Bowel:  No obstruction. No appendicitis.  Gastric bypass. Vascular/Lymphatic: Mild atherosclerotic plaque. Venous findings noted below. No mass or adenopathy. Reproductive:Malpositioned IUD which is rotated with the side-arm through the lower uterine segment that approaches the serosa. The stem is at the level of the uterine and cervical junction. Nonobstructive filling defect the level of the mid right ovarian vein, see coronal reformats, with some regional fat stranding. The ovaries themselves appear symmetric. Other: No ascites or pneumoperitoneum. Musculoskeletal: No acute abnormalities.  Spondylosis. IMPRESSION: 1. Mild acute edematous pancreatitis. 2. 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 3. Malpositioned IUD which is low and rotated with 1 of the side arms penetrating the lower uterine segment. 4. Hepatic steatosis. 5. Gastric bypass. Electronically Signed   By: Marnee Spring M.D.   On: 04/26/2019 18:55   DG Abd Portable 1 View  Result Date: 04/26/2019 CLINICAL DATA:  Abdominal pain.  Emesis. EXAM: PORTABLE ABDOMEN - 1 VIEW COMPARISON:  None FINDINGS: Mid abdominal small bowel loop measures up to 3.4 cm. Right hemidiaphragm elevation. No free intraperitoneal air. Surgical sutures in the region of the stomach, consistent with Roux-en-Y gastric bypass. No abnormal abdominal calcifications. No appendicolith. Pelvic calcifications are most likely phleboliths. Intrauterine device. IMPRESSION: Nonspecific loop of borderline dilated mid abdominal small bowel. No specific evidence of bowel obstruction and no evidence of free intraperitoneal air. Electronically Signed   By: Jeronimo Greaves M.D.   On:  04/26/2019 16:29    Procedures .Critical Care Performed by: Jeanie Sewer, PA-C Authorized by: Luevenia Maxin, Yoskar Murrillo A, PA-C  Critical care provider statement:    Critical care time (minutes):  45   Critical care was necessary to treat or prevent imminent or life-threatening deterioration of the following conditions:  Circulatory failure and metabolic crisis   Critical care was time spent personally by me on the following activities:  Discussions with consultants, evaluation of patient's response to treatment, examination of patient, ordering and performing treatments and interventions, ordering and review of laboratory studies, ordering and review of radiographic studies, pulse oximetry, re-evaluation of patient's condition, obtaining history from patient or surrogate and review of old charts   (including critical care time)  Medications Ordered in ED Medications  potassium chloride 10 mEq in 100 mL IVPB (10 mEq Intravenous New Bag/Given 04/26/19 1924)  amLODipine (NORVASC) tablet 5 mg (has no administration in time range)  cyclobenzaprine (FLEXERIL) tablet 5 mg (has no administration in time range)  potassium chloride SA (KLOR-CON) CR tablet 40 mEq (has no administration in time range)  sodium chloride flush (NS) 0.9 % injection 3 mL (3 mLs Intravenous Given 04/26/19 1611)  0.9 %  sodium chloride infusion (10 mL/hr Intravenous New Bag/Given 04/26/19 1837)  pantoprazole (PROTONIX) injection 40 mg (40 mg Intravenous Given 04/26/19 1654)  magnesium sulfate IVPB 2 g 50 mL (0 g Intravenous Stopped 04/26/19 1923)  morphine 4 MG/ML injection 4 mg (4 mg Intravenous Given 04/26/19 1722)  iohexol (OMNIPAQUE) 300 MG/ML solution 100 mL (100 mLs Intravenous Contrast Given 04/26/19 1818)  LORazepam (ATIVAN) injection 0.5 mg (0.5 mg Intravenous Given 04/26/19 1857)    ED Course  I have reviewed the triage vital signs and the nursing notes.  Pertinent labs & imaging results that were available during my care of the  patient were reviewed by me and considered in my medical decision making (see chart for details).    MDM Rules/Calculators/A&P                      Patient presenting for evaluation of progressively worsening epigastric abdominal pain with associated back pain for 3 weeks, generalized weakness and dyspnea on exertion for 1 week, nausea and vomiting beginning today.  She is afebrile, intermittently tachycardic and hypertensive.  Abdomen soft, no rebound or guarding noted.  Lab work reviewed by me shows anemia with hemoglobin of 6.7.  Unfortunately I do not have any labs to compare to in her system so I am not sure what her baseline is.  She denies heavy menstrual cycles but has a history of gastric bypass and takes NSAIDs and drinks alcohol daily.  Concern for possible peptic ulcer disease or bleeding ulcer?Girtha Rm of lab work reviewed by me shows hypokalemia with potassium 2.5 and hypomagnesemia.  This was replenished intravenously in the ED.  LFTs are elevated with a an AST to ALT ratio suggesting alcohol abuse.  Her lipase is elevated 3 times above the upper limit of normal suggesting possible pancreatitis.  Point-of-care Hemoccult is negative.  Imaging today significant for mild acute pancreatitis.   Spoke with Dr. Julian Reil with [facility] hospitalist service who agrees to assume care of patient and bring her to the hospital for further evaluation and management.  Final Clinical Impression(s) / ED Diagnoses Final diagnoses:  Acute pancreatitis without infection or necrosis, unspecified pancreatitis type  Symptomatic anemia  Hypokalemia  Hypomagnesemia    Rx / DC Orders ED Discharge Orders    None       Bennye Alm 04/26/19 2025    Pricilla Loveless, MD  04/30/19 0711  

## 2019-04-27 ENCOUNTER — Other Ambulatory Visit: Payer: Self-pay

## 2019-04-27 DIAGNOSIS — D520 Dietary folate deficiency anemia: Secondary | ICD-10-CM

## 2019-04-27 DIAGNOSIS — D513 Other dietary vitamin B12 deficiency anemia: Secondary | ICD-10-CM

## 2019-04-27 DIAGNOSIS — Z7289 Other problems related to lifestyle: Secondary | ICD-10-CM

## 2019-04-27 DIAGNOSIS — Z8719 Personal history of other diseases of the digestive system: Secondary | ICD-10-CM

## 2019-04-27 DIAGNOSIS — R7989 Other specified abnormal findings of blood chemistry: Secondary | ICD-10-CM

## 2019-04-27 DIAGNOSIS — K8500 Idiopathic acute pancreatitis without necrosis or infection: Secondary | ICD-10-CM

## 2019-04-27 LAB — COMPREHENSIVE METABOLIC PANEL
ALT: 40 U/L (ref 0–44)
AST: 117 U/L — ABNORMAL HIGH (ref 15–41)
Albumin: 3.3 g/dL — ABNORMAL LOW (ref 3.5–5.0)
Alkaline Phosphatase: 72 U/L (ref 38–126)
Anion gap: 10 (ref 5–15)
BUN: <5 mg/dL — ABNORMAL LOW (ref 6–20)
CO2: 25 mmol/L (ref 22–32)
Calcium: 7.4 mg/dL — ABNORMAL LOW (ref 8.9–10.3)
Chloride: 103 mmol/L (ref 98–111)
Creatinine, Ser: 0.56 mg/dL (ref 0.44–1.00)
GFR calc Af Amer: >60 mL/min (ref >60)
GFR calc non Af Amer: >60 mL/min (ref >60)
Glucose, Bld: 125 mg/dL — ABNORMAL HIGH (ref 70–99)
Potassium: 2.7 mmol/L — CL (ref 3.5–5.1)
Sodium: 138 mmol/L (ref 135–145)
Total Bilirubin: 1.1 mg/dL (ref 0.3–1.2)
Total Protein: 6.5 g/dL (ref 6.5–8.1)

## 2019-04-27 LAB — CBC
HCT: 25.1 % — ABNORMAL LOW (ref 36.0–46.0)
Hemoglobin: 7.2 g/dL — ABNORMAL LOW (ref 12.0–15.0)
MCH: 20.6 pg — ABNORMAL LOW (ref 26.0–34.0)
MCHC: 28.7 g/dL — ABNORMAL LOW (ref 30.0–36.0)
MCV: 71.9 fL — ABNORMAL LOW (ref 80.0–100.0)
Platelets: 148 10*3/uL — ABNORMAL LOW (ref 150–400)
RBC: 3.49 MIL/uL — ABNORMAL LOW (ref 3.87–5.11)
RDW: 31.7 % — ABNORMAL HIGH (ref 11.5–15.5)
WBC: 2.6 10*3/uL — ABNORMAL LOW (ref 4.0–10.5)
nRBC: 1.6 % — ABNORMAL HIGH (ref 0.0–0.2)

## 2019-04-27 LAB — BASIC METABOLIC PANEL
Anion gap: 8 (ref 5–15)
BUN: <5 mg/dL — ABNORMAL LOW (ref 6–20)
CO2: 24 mmol/L (ref 22–32)
Calcium: 7.5 mg/dL — ABNORMAL LOW (ref 8.9–10.3)
Chloride: 107 mmol/L (ref 98–111)
Creatinine, Ser: 0.56 mg/dL (ref 0.44–1.00)
GFR calc Af Amer: >60 mL/min (ref >60)
GFR calc non Af Amer: >60 mL/min (ref >60)
Glucose, Bld: 110 mg/dL — ABNORMAL HIGH (ref 70–99)
Potassium: 3.3 mmol/L — ABNORMAL LOW (ref 3.5–5.1)
Sodium: 139 mmol/L (ref 135–145)

## 2019-04-27 LAB — SARS CORONAVIRUS 2 (TAT 6-24 HRS): SARS Coronavirus 2: NEGATIVE

## 2019-04-27 LAB — MAGNESIUM: Magnesium: 1.9 mg/dL (ref 1.7–2.4)

## 2019-04-27 MED ORDER — POTASSIUM CHLORIDE CRYS ER 20 MEQ PO TBCR
40.0000 meq | EXTENDED_RELEASE_TABLET | ORAL | Status: AC
  Administered 2019-04-27 (×2): 40 meq via ORAL
  Filled 2019-04-27 (×2): qty 2

## 2019-04-27 MED ORDER — MAGNESIUM SULFATE 2 GM/50ML IV SOLN
2.0000 g | Freq: Once | INTRAVENOUS | Status: AC
  Administered 2019-04-27: 2 g via INTRAVENOUS
  Filled 2019-04-27: qty 50

## 2019-04-27 NOTE — H&P (View-Only) (Signed)
Gastroenterology Inpatient Consultation   Attending Requesting Consult Benito Mccreedy, [facility] Day: 2  Reason for Consult Anemia with history of rectal bleeding and pancreatitis   History of Present Illness  Laura Hunter is a 39 y.o. female with a pmh significant for status post Roux-en-Y gastric bypass, hypertension, obesity, chronic alcohol use.  The GI service is consulted for evaluation and management of anemia with prior history of rectal bleeding as well as pancreatitis.  The patient has had issues of progressive abdominal discomfort over the course the last 3 to 4 weeks.  She is also had worsening shortness of breath on exertion.  She had issues with recent nausea and vomiting.  She has had anorexia but has not had significant weight loss.  She takes infrequent Aleve as well as continues to be an alcohol consumer on a near daily basis.  The patient presented for further evaluation and underwent cross-sectional imaging as well as laboratories.  She was found to be anemic.  She also had an lipase of 156 just greater than 3 times the upper limit of normal.  CT imaging suggested some pancreatitis.  No mass or lesion was noted in the GI tract.  She had a occlusive ovarian thrombosis and an IUD malpositioning but no other significant findings.  Patient had rectal bleeding approximately 5 to 6 months ago.  She actually saw Dr. Tarri Glenn and was scheduled for a colonoscopy but canceled.  She did not really schedule this.  She has menorrhagia and continues to have periods but her menorrhagia occurs only for 2 days of the month.  They are not significantly heavier than normal.  She has been on iron orally in the past but not been on it for years.  Is not taking significant vitamins as she showed for her history of Roux-en-Y.Her iron indices do not suggest overt iron deficiency but they do show a low folate as well as a low B12.  The patient has not had an upper or lower  endoscopy.  GI Review of Systems Positive as above Negative for dysphagia, odynophagia, melena, hematochezia, change in bowel habits   Review of Systems  General: Denies fevers/chills/weight loss HEENT: Denies oral lesions Cardiovascular: Denies chest pain Pulmonary: Positive for shortness of breath as outlined in HPI gastroenterological: See HPI Genitourinary: Denies darkened urine or hematuria Hematological: Denies easy bruising/bleeding Dermatological: Denies jaundice Psychological: Mood is anxious to know what is a cause of her pain   Histories  Past Medical History Past Medical History:  Diagnosis Date  . Hypertension   . Obesity    Past Surgical History:  Procedure Laterality Date  . CESAREAN SECTION    . ROUX-EN-Y GASTRIC BYPASS      Allergies Allergies  Allergen Reactions  . Lisinopril Cough    Family History Family History  Problem Relation Age of Onset  . HIV/AIDS Mother   . HIV/AIDS Father   . Diabetes Brother   She does not know much about her family history per her report   Social History Social History   Socioeconomic History  . Marital status: Single    Spouse name: Not on file  . Number of children: Not on file  . Years of education: Not on file  . Highest education level: Not on file  Occupational History  . Not on file  Tobacco Use  . Smoking status: Former Research scientist (life sciences)  . Smokeless tobacco: Never Used  Substance and Sexual Activity  . Alcohol use: Yes  Alcohol/week: 6.0 standard drinks    Types: 6 Shots of liquor per week    Comment: 3 to 6 shots daily of Bacardi  . Drug use: Never  . Sexual activity: Yes    Partners: Male    Birth control/protection: I.U.D.  Other Topics Concern  . Not on file  Social History Narrative  . Not on file   Social Determinants of Health   Financial Resource Strain:   . Difficulty of Paying Living Expenses: Not on file  Food Insecurity:   . Worried About Charity fundraiser in the Last Year: Not  on file  . Ran Out of Food in the Last Year: Not on file  Transportation Needs:   . Lack of Transportation (Medical): Not on file  . Lack of Transportation (Non-Medical): Not on file  Physical Activity:   . Days of Exercise per Week: Not on file  . Minutes of Exercise per Session: Not on file  Stress:   . Feeling of Stress : Not on file  Social Connections:   . Frequency of Communication with Friends and Family: Not on file  . Frequency of Social Gatherings with Friends and Family: Not on file  . Attends Religious Services: Not on file  . Active Member of Clubs or Organizations: Not on file  . Attends Archivist Meetings: Not on file  . Marital Status: Not on file  Intimate Partner Violence:   . Fear of Current or Ex-Partner: Not on file  . Emotionally Abused: Not on file  . Physically Abused: Not on file  . Sexually Abused: Not on file     Medications  Home Medications No current facility-administered medications on file prior to encounter.   Current Outpatient Medications on File Prior to Encounter  Medication Sig Dispense Refill  . amLODipine (NORVASC) 5 MG tablet Take 5 mg by mouth daily.     . cyclobenzaprine (FLEXERIL) 5 MG tablet Take 5 mg by mouth 3 (three) times daily as needed for muscle spasms.     . hydrochlorothiazide (HYDRODIURIL) 12.5 MG tablet Take 12.5 mg by mouth daily.     . naproxen sodium (ALEVE) 220 MG tablet Take 220 mg by mouth as needed (pain).     Scheduled Inpatient Medications . amLODipine  5 mg Oral Daily   Continuous Inpatient Infusions . sodium chloride 125 mL/hr at 04/27/19 0505   PRN Inpatient Medications acetaminophen **OR** acetaminophen, cyclobenzaprine, morphine injection, ondansetron **OR** ondansetron (ZOFRAN) IV   Physical Examination  BP (!) 167/107 (BP Location: Right Arm)   Pulse 93   Temp 99.6 F (37.6 C) (Oral)   Resp 18   Ht 5' 2"  (1.575 m)   Wt 90.6 kg   SpO2 99%   BMI 36.54 kg/m  GEN: NAD, appears  stated age, doesn't appear chronically ill  PSYCH: Cooperative, without pressured speech EYE: Conjunctivae pink, sclerae anicteric ENT: MMM CV: RR without R/Gs  RESP: CTAB posteriorly, without wheezing GI: NABS, soft, surgical scars present, tenderness to palpation in mid abdomen radiating to back upon deep palpation, volitional guarding, no rebound, unable to appreciate hepatosplenomegaly MSK/EXT: Trace bilateral lower extremity edema SKIN: No jaundice, no spider angiomata noted on upper thorax NEURO:  Alert & Oriented x 3, no focal deficits, no evidence of asterixis   Review of Data  I reviewed the following data at the time of this encounter:  Laboratory Studies   Recent Labs  Lab 04/26/19 1333 04/26/19 1545 04/27/19 0209  NA   < >  --  138  K   < >  --  2.7*  CL   < >  --  103  CO2   < >  --  25  BUN   < >  --  <5*  CREATININE   < >  --  0.56  GLUCOSE   < >  --  125*  CALCIUM   < >  --  7.4*  MG  --  1.1*  --    < > = values in this interval not displayed.   Recent Labs  Lab 04/27/19 0209  AST 117*  ALT 40  ALKPHOS 72    Recent Labs  Lab 04/26/19 1333 04/26/19 1333 04/27/19 0209  WBC 4.4   < > 2.6*  HGB 6.7*   < > 7.2*  HCT 24.7*   < > 25.1*  PLT 218  --  148*   < > = values in this interval not displayed.   No results for input(s): APTT, INR in the last 168 hours. Computed MELD-Na score unavailable. Necessary lab results were not found in the last year. Computed MELD score unavailable. Necessary lab results were not found in the last year.  Imaging Studies  CT abdomen pelvis with contrast IMPRESSION: 1. Mild acute edematous pancreatitis. 2. 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 3. Malpositioned IUD which is low and rotated with 1 of the side arms penetrating the lower uterine segment. 4. Hepatic steatosis. 5. Gastric bypass.  GI Procedures and Studies  No relevant studies to review   Assessment  Laura Hunter is a 39 y.o. female with a  pmh significant for status post Roux-en-Y gastric bypass, hypertension, obesity, chronic alcohol use.  The GI service is consulted for evaluation and management of anemia with prior history of rectal bleeding as well as pancreatitis.  The etiology of the patient's pancreatitis is not clear.  Although alcohol seems to be the most likely factor, with that being said, we will rule out a few other things including checking triglycerides as well as her calcium levels.  Although the CT scan did not suggest that she had gallstones she seems to still have her gallbladder and she should have a ultrasound performed to evaluate whether she may have gallstones.  She has elevated liver tests of an unclear etiology and these could be due to fatty liver as being noted on the CT scan but there could be other things that could be causing this.  We will send off a basic serological work-up.  In regards to the anemia, I do think it is worthwhile for [place] to consider an endoscopic evaluation when the patient's pancreatitis is better.  But see how she is feeling into tomorrow and I will advance her diet today to clear liquids since she has not had anything all day today.  Does not seem to have evidence of severe pancreatitis but we will have to see how her labs look into tomorrow.  Inflammatory markers should be drawn on day 1 and day 3 I will try to add them on today and if not they will be drawn tomorrow.  The risks and benefits of endoscopic evaluation were discussed with the patient; these include but are not limited to the risk of perforation, infection, bleeding, missed lesions, lack of diagnosis, severe illness requiring hospitalization, as well as anesthesia and sedation related illnesses.  The patient is agreeable to proceed when we feel she is safe potentially on Monday versus Tuesday if her pancreatitis is significantly  improving.   Plan/Recommendations  Obtain ESR/CRP Obtain triglycerides Obtain ionized calcium In the  next 1 to 2 days obtain a right upper quadrant ultrasound to evaluate for gallstone disease Clear liquid diet today She has been doing tomorrow If patient having clinical improvement will consider endoscopic evaluation with EGD/colonoscopy as had previously been the plan as an outpatient so she did not follow-up on this Patient needs to be given vitamin B12 due to her insufficiency Patient needs to be given folate due to her insufficiency Further work-up as per medicine service in regards to IUD issues noted on CT   Thank you for this consult.  We will continue to follow.  Please page/call with questions or concerns.   Justice Britain, MD [facility] Office # [phone]

## 2019-04-27 NOTE — Plan of Care (Signed)
  Problem: Education: Goal: Knowledge of Pancreatitis treatment and prevention will improve Outcome: Progressing   Problem: Education: Goal: Knowledge of General Education information will improve Description: Including pain rating scale, medication(s)/side effects and non-pharmacologic comfort measures Outcome: Progressing   Problem: Pain Managment: Goal: General experience of comfort will improve Outcome: Progressing   Problem: Safety: Goal: Ability to remain free from injury will improve Outcome: Progressing

## 2019-04-27 NOTE — Progress Notes (Signed)
[facility]                                                                              Patient Demographics  Laura Hunter, is a 39 y.o. female, DOB - Feb 18, 1981, UXL:[id number]  Admit date - 04/26/2019   Admitting Physician Hillary Bow, DO  Outpatient Primary MD for the patient is Patient, No Pcp Per  Outpatient specialists:   LOS - 1  days    Chief Complaint  Patient presents with  . Dizziness       Brief summary  Laura Hunter is a 39 y.o. female with medical history significant for HTN, obesity s/p Roux-en-y 2012 presenting with 3-week history of progressively worsening abdominal pain associated with generalized weakness and dyspnea on exertion.  ED evaluation noted AST of 138, ALT 48, potassium 2.5, hemoglobin 6.7, lipase 157, and abdominal CT noted acute edematous pancreatitis.  Patient gives remote history of melanotic stools 6 months ago for which was supposed to get colonoscopy but never got.  She drinks about 6 shots of rum a few times a week   Assessment & Plan    Principal Problem:   Acute alcoholic pancreatitis Active Problems:   Anemia due to GI blood loss   Malpositioned IUD   Thrombosis of ovarian vein   Hypokalemia  Acute alcoholic pancreatitis Abdomen CT noted pancreatic edema without concerns for bladder obstruction or stones History of alcohol abuse Bowel rest IV fluids Analgesic support with antinauseants/antiemetics as needed Monitor hepatic enzymes with LFTs  Anemia due to chronic GI bleeding Hemoglobin 6.7 on admission PRBC transfusion given on admission Patient has been taking NSAIDs-consult to stop use History of melanotic stools Was planned for colonoscopy-but this was not done No evidence of acute ongoing GI loss Monitor hematologic indicis and transfuse as needed [facility] consulted  Hypokalemia Potassium repletion ordered with follow-up potassium and magnesium  Malpositioned IUD and partial  thrombosis of ovarian vein Noted per CT scan No clinical evidence of correlating symptomatology No blood thinners for now due to chronic GI bleed -would hold till post endoscopic evaluation to decide Possible gynecology consultation as outpatient  Code Status: Full code DVT Prophylaxis:   SCD's Family Communication: Discussed in detail with the patient, all imaging results, lab results explained to the patient   Disposition Plan: Home  Time Spent in minutes 35 minutes  Procedures:    Consultants:   Gastroenterology  Antimicrobials:      Medications  Scheduled Meds: . amLODipine  5 mg Oral Daily  . potassium chloride  40 mEq Oral Q4H   Continuous Infusions: . sodium chloride 125 mL/hr at 04/27/19 0505  . magnesium sulfate bolus IVPB     PRN Meds:.acetaminophen **OR** acetaminophen, cyclobenzaprine, morphine injection, ondansetron **OR** ondansetron (ZOFRAN) IV   Antibiotics   Anti-infectives (From admission, onward)   None        Subjective:   Laura Hunter was seen and examined today.  Abdominal pain with nausea better.  Denies any melena or melanotic stools.  Denies dizziness but does feel weak some Objective:   Vitals:   04/26/19 2252 04/27/19 0202 04/27/19 0453 04/27/19 0800  BP: (!) 177/107 (!) 152/97 (!) 158/102 (!) 167/107  Pulse: 89 91 93   Resp: 18  18 18   Temp: 99 F (37.2 C)  F (37.2 C) 99.6 F (37.6 C)  TempSrc: Oral   Oral  SpO2: 100%  99% 99%  Weight: 90.6 kg     Height:        Intake/Output Summary (Last 24 hours) at 04/27/2019 1026 Last data filed at 04/27/2019 06/25/2019 Gross per 24 hour  Intake 1815.22 ml  Output 400 ml  Net 1415.22 ml     Wt Readings from Last 3 Encounters:  04/26/19 90.6 kg  09/20/18 93 kg     Exam  General: NAD  HEENT: NCAT,  PERRL,MMM  Neck: SUPPLE, (-) JVD  Cardiovascular: RRR, (-) GALLOP, (-) MURMUR  Respiratory: CTA  Gastrointestinal: SOFT, (-) DISTENSION, BS(+), (+) epigastric  TENDERNESS  Ext: (-) CYANOSIS, (-) EDEMA  Neuro: A, OX 3  Skin:(-) RASH     Data Reviewed:  I have personally reviewed following labs and imaging studies  Micro Results Recent Results (from the past 240 hour(s))  SARS CORONAVIRUS 2 (TAT 6-24 HRS) Nasopharyngeal Nasopharyngeal Swab     Status: None   Collection Time: 04/26/19  7:27 PM   Specimen: Nasopharyngeal Swab  Result Value Ref Range Status   SARS Coronavirus 2 NEGATIVE NEGATIVE Final    Comment: (NOTE) SARS-CoV-2 target nucleic acids are NOT DETECTED. The SARS-CoV-2 RNA is generally detectable in upper and lower respiratory specimens during the acute phase of infection. Negative results do not preclude SARS-CoV-2 infection, do not rule out co-infections with other pathogens, and should not be used as the sole basis for treatment or other patient management decisions. Negative results must be combined with clinical observations, patient history, and epidemiological information. The expected result is Negative. Fact Sheet for Patients: 06/24/19 Fact Sheet for Healthcare Providers: HairSlick.no This test is not yet approved or cleared by the quierodirigir.com FDA and  has been authorized for detection and/or diagnosis of SARS-CoV-2 by FDA under an Emergency Use Authorization (EUA). This EUA will remain  in effect (meaning this test can be used) for the duration of the COVID-19 declaration under Section 56 4(b)(1) of the Act, 21 U.S.C. section 360bbb-3(b)(1), unless the authorization is terminated or revoked sooner. Performed at [facility], [address], [place], [place]     Radiology Reports CT ABDOMEN PELVIS W CONTRAST  Result Date: 04/26/2019 CLINICAL DATA:  Cholecystitis.  Dizziness and shortness of breath. EXAM: CT ABDOMEN AND PELVIS WITH CONTRAST TECHNIQUE: Multidetector CT imaging of the abdomen and pelvis was performed using the  standard protocol following bolus administration of intravenous contrast. CONTRAST:  06/24/2019 OMNIPAQUE IOHEXOL 300 MG/ML  SOLN COMPARISON:  None. FINDINGS: Lower chest:  No contributory findings. Hepatobiliary: Hepatic steatosis.No evidence of biliary obstruction or stone. Pancreas: Fat stranding around the head and uncinate process tracking into the mesentery. There is correlative elevated lipase. No ductal dilatation or collection. Spleen: Unremarkable. Adrenals/Urinary Tract: Negative adrenals. No hydronephrosis or stone. Unremarkable bladder. Stomach/Bowel:  No obstruction. No appendicitis.  Gastric bypass. Vascular/Lymphatic: Mild atherosclerotic plaque. Venous findings noted below. No mass or adenopathy. Reproductive:Malpositioned IUD which is rotated with the side-arm through the lower uterine segment that approaches the serosa. The stem is at the level of the uterine and cervical junction. Nonobstructive filling defect the level of the mid right ovarian vein, see coronal reformats, with some regional fat stranding. The ovaries themselves appear symmetric. Other: No  ascites or pneumoperitoneum. Musculoskeletal: No acute abnormalities.  Spondylosis. IMPRESSION: 1. Mild acute edematous pancreatitis. 2. 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 3. Malpositioned IUD which is low and rotated with 1 of the side arms penetrating the lower uterine segment. 4. Hepatic steatosis. 5. Gastric bypass. Electronically Signed   By: Monte Fantasia M.D.   On: 04/26/2019 18:55   DG Abd Portable 1 View  Result Date: 04/26/2019 CLINICAL DATA:  Abdominal pain.  Emesis. EXAM: PORTABLE ABDOMEN - 1 VIEW COMPARISON:  None FINDINGS: Mid abdominal small bowel loop measures up to 3.4 cm. Right hemidiaphragm elevation. No free intraperitoneal air. Surgical sutures in the region of the stomach, consistent with Roux-en-Y gastric bypass. No abnormal abdominal calcifications. No appendicolith. Pelvic calcifications are most likely  phleboliths. Intrauterine device. IMPRESSION: Nonspecific loop of borderline dilated mid abdominal small bowel. No specific evidence of bowel obstruction and no evidence of free intraperitoneal air. Electronically Signed   By: Abigail Miyamoto M.D.   On: 04/26/2019 16:29    Lab Data:  CBC: Recent Labs  Lab 04/26/19 1333 04/26/19 1545 04/27/19 0209  WBC 4.4  --  2.6*  NEUTROABS  --  2.7  --   HGB 6.7*  --  7.2*  HCT 24.7*  --  25.1*  MCV 70.8*  --  71.9*  PLT 218  --  086*   Basic Metabolic Panel: Recent Labs  Lab 04/26/19 1333 04/26/19 1545 04/27/19 0209  NA 138  --  138  K 2.5*  --  2.7*  CL 99  --  103  CO2 24  --  25  GLUCOSE 113*  --  125*  BUN <5*  --  <5*  CREATININE 0.61  --  0.56  CALCIUM 7.8*  --  7.4*  MG  --  1.1*  --    GFR: Estimated Creatinine Clearance: 97.8 mL/min (by C-G formula based on SCr of 0.56 mg/dL). Liver Function Tests: Recent Labs  Lab 04/26/19 1545 04/27/19 0209  AST 138* 117*  ALT 46* 40  ALKPHOS 72 72  BILITOT 1.2 1.1  PROT 7.0 6.5  ALBUMIN 3.6 3.3*   Recent Labs  Lab 04/26/19 1545  LIPASE 157*   No results for input(s): AMMONIA in the last 168 hours. Coagulation Profile: No results for input(s): INR, PROTIME in the last 168 hours. Cardiac Enzymes: No results for input(s): CKTOTAL, CKMB, CKMBINDEX, TROPONINI in the last 168 hours. BNP (last 3 results) No results for input(s): PROBNP in the last 8760 hours. HbA1C: No results for input(s): HGBA1C in the last 72 hours. CBG: Recent Labs  Lab 04/26/19 1508  GLUCAP 114*   Lipid Profile: No results for input(s): CHOL, HDL, LDLCALC, TRIG, CHOLHDL, LDLDIRECT in the last 72 hours. Thyroid Function Tests: No results for input(s): TSH, T4TOTAL, FREET4, T3FREE, THYROIDAB in the last 72 hours. Anemia Panel: Recent Labs    04/26/19 1545  VITAMINB12 130*  FOLATE 5.5*  FERRITIN 23  TIBC 421  IRON 196*  RETICCTPCT 0.9   Urine analysis:    Component Value Date/Time    COLORURINE YELLOW 04/26/2019 1510   APPEARANCEUR CLEAR 04/26/2019 1510   LABSPEC 1.011 04/26/2019 1510   PHURINE 6.0 04/26/2019 1510   GLUCOSEU NEGATIVE 04/26/2019 1510   HGBUR SMALL (A) 04/26/2019 1510   BILIRUBINUR NEGATIVE 04/26/2019 1510   KETONESUR NEGATIVE 04/26/2019 1510   PROTEINUR NEGATIVE 04/26/2019 1510   NITRITE NEGATIVE 04/26/2019 1510   LEUKOCYTESUR NEGATIVE 04/26/2019 1510     Iona Beard Osei-Bonsu M.D. [facility] 04/27/2019, 10:26 AM  Pager: [phone] Between 7am to 7pm - call Pager - [phone]  After 7pm go to [email or web address] - password [email or web address]  Call night coverage person covering after 7pm

## 2019-04-27 NOTE — Progress Notes (Signed)
CRITICAL VALUE ALERT  Critical Value: potassium - 2.7  Date & Time Notied:  04/27/2019 at 0321am  Provider Notified: Bodenheimer,NP  Orders Received/Actions taken: Awaiting provider's orders

## 2019-04-27 NOTE — Consult Note (Signed)
Gastroenterology Inpatient Consultation   Attending Requesting Consult Benito Mccreedy, [place] [facility] Day: 2  Reason for Consult Anemia with history of rectal bleeding and pancreatitis   History of Present Illness  Lamiya Veldman is a 39 y.o. female with a pmh significant for status post Roux-en-Y gastric bypass, hypertension, obesity, chronic alcohol use.  The GI service is consulted for evaluation and management of anemia with prior history of rectal bleeding as well as pancreatitis.  The patient has had issues of progressive abdominal discomfort over the course the last 3 to 4 weeks.  She is also had worsening shortness of breath on exertion.  She had issues with recent nausea and vomiting.  She has had anorexia but has not had significant weight loss.  She takes infrequent Aleve as well as continues to be an alcohol consumer on a near daily basis.  The patient presented for further evaluation and underwent cross-sectional imaging as well as laboratories.  She was found to be anemic.  She also had an lipase of 156 just greater than 3 times the upper limit of normal.  CT imaging suggested some pancreatitis.  No mass or lesion was noted in the GI tract.  She had a occlusive ovarian thrombosis and an IUD malpositioning but no other significant findings.  Patient had rectal bleeding approximately 5 to 6 months ago.  She actually saw Dr. Tarri Glenn and was scheduled for a colonoscopy but canceled.  She did not really schedule this.  She has menorrhagia and continues to have periods but her menorrhagia occurs only for 2 days of the month.  They are not significantly heavier than normal.  She has been on iron orally in the past but not been on it for years.  Is not taking significant vitamins as she showed for her history of Roux-en-Y.Her iron indices do not suggest overt iron deficiency but they do show a low folate as well as a low B12.  The patient has not had an upper or lower  endoscopy.  GI Review of Systems Positive as above Negative for dysphagia, odynophagia, melena, hematochezia, change in bowel habits   Review of Systems  General: Denies fevers/chills/weight loss HEENT: Denies oral lesions Cardiovascular: Denies chest pain Pulmonary: Positive for shortness of breath as outlined in HPI gastroenterological: See HPI Genitourinary: Denies darkened urine or hematuria Hematological: Denies easy bruising/bleeding Dermatological: Denies jaundice Psychological: Mood is anxious to know what is a cause of her pain   Histories  Past Medical History Past Medical History:  Diagnosis Date  . Hypertension   . Obesity    Past Surgical History:  Procedure Laterality Date  . CESAREAN SECTION    . ROUX-EN-Y GASTRIC BYPASS      Allergies Allergies  Allergen Reactions  . Lisinopril Cough    Family History Family History  Problem Relation Age of Onset  . HIV/AIDS Mother   . HIV/AIDS Father   . Diabetes Brother   She does not know much about her family history per her report   Social History Social History   Socioeconomic History  . Marital status: Single    Spouse name: Not on file  . Number of children: Not on file  . Years of education: Not on file  . Highest education level: Not on file  Occupational History  . Not on file  Tobacco Use  . Smoking status: Former Research scientist (life sciences)  . Smokeless tobacco: Never Used  Substance and Sexual Activity  . Alcohol use: Yes  Alcohol/week: 6.0 standard drinks    Types: 6 Shots of liquor per week    Comment: 3 to 6 shots daily of Bacardi  . Drug use: Never  . Sexual activity: Yes    Partners: Male    Birth control/protection: I.U.D.  Other Topics Concern  . Not on file  Social History Narrative  . Not on file   Social Determinants of Health   Financial Resource Strain:   . Difficulty of Paying Living Expenses: Not on file  Food Insecurity:   . Worried About Charity fundraiser in the Last Year: Not  on file  . Ran Out of Food in the Last Year: Not on file  Transportation Needs:   . Lack of Transportation (Medical): Not on file  . Lack of Transportation (Non-Medical): Not on file  Physical Activity:   . Days of Exercise per Week: Not on file  . Minutes of Exercise per Session: Not on file  Stress:   . Feeling of Stress : Not on file  Social Connections:   . Frequency of Communication with Friends and Family: Not on file  . Frequency of Social Gatherings with Friends and Family: Not on file  . Attends Religious Services: Not on file  . Active Member of Clubs or Organizations: Not on file  . Attends Archivist Meetings: Not on file  . Marital Status: Not on file  Intimate Partner Violence:   . Fear of Current or Ex-Partner: Not on file  . Emotionally Abused: Not on file  . Physically Abused: Not on file  . Sexually Abused: Not on file     Medications  Home Medications No current facility-administered medications on file prior to encounter.   Current Outpatient Medications on File Prior to Encounter  Medication Sig Dispense Refill  . amLODipine (NORVASC) 5 MG tablet Take 5 mg by mouth daily.     . cyclobenzaprine (FLEXERIL) 5 MG tablet Take 5 mg by mouth 3 (three) times daily as needed for muscle spasms.     . hydrochlorothiazide (HYDRODIURIL) 12.5 MG tablet Take 12.5 mg by mouth daily.     . naproxen sodium (ALEVE) 220 MG tablet Take 220 mg by mouth as needed (pain).     Scheduled Inpatient Medications . amLODipine  5 mg Oral Daily   Continuous Inpatient Infusions . sodium chloride 125 mL/hr at 04/27/19 0505   PRN Inpatient Medications acetaminophen **OR** acetaminophen, cyclobenzaprine, morphine injection, ondansetron **OR** ondansetron (ZOFRAN) IV   Physical Examination  BP (!) 167/107 (BP Location: Right Arm)   Pulse 93   Temp 99.6 F (37.6 C) (Oral)   Resp 18   Ht 5' 2"  (1.575 m)   Wt 90.6 kg   SpO2 99%   BMI 36.54 kg/m  GEN: NAD, appears  stated age, doesn't appear chronically ill  PSYCH: Cooperative, without pressured speech EYE: Conjunctivae pink, sclerae anicteric ENT: MMM CV: RR without R/Gs  RESP: CTAB posteriorly, without wheezing GI: NABS, soft, surgical scars present, tenderness to palpation in mid abdomen radiating to back upon deep palpation, volitional guarding, no rebound, unable to appreciate hepatosplenomegaly MSK/EXT: Trace bilateral lower extremity edema SKIN: No jaundice, no spider angiomata noted on upper thorax NEURO:  Alert & Oriented x 3, no focal deficits, no evidence of asterixis   Review of Data  I reviewed the following data at the time of this encounter:  Laboratory Studies   Recent Labs  Lab 04/26/19 1333 04/26/19 1545 04/27/19 0209  NA   < >  --  138  K   < >  --  2.7*  CL   < >  --  103  CO2   < >  --  25  BUN   < >  --  <5*  CREATININE   < >  --  0.56  GLUCOSE   < >  --  125*  CALCIUM   < >  --  7.4*  MG  --  1.1*  --    < > = values in this interval not displayed.   Recent Labs  Lab 04/27/19 0209  AST 117*  ALT 40  ALKPHOS 72    Recent Labs  Lab 04/26/19 1333 04/26/19 1333 04/27/19 0209  WBC 4.4   < > 2.6*  HGB 6.7*   < > 7.2*  HCT 24.7*   < > 25.1*  PLT 218  --  148*   < > = values in this interval not displayed.   No results for input(s): APTT, INR in the last 168 hours. Computed MELD-Na score unavailable. Necessary lab results were not found in the last year. Computed MELD score unavailable. Necessary lab results were not found in the last year.  Imaging Studies  CT abdomen pelvis with contrast IMPRESSION: 1. Mild acute edematous pancreatitis. 2. 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 3. Malpositioned IUD which is low and rotated with 1 of the side arms penetrating the lower uterine segment. 4. Hepatic steatosis. 5. Gastric bypass.  GI Procedures and Studies  No relevant studies to review   Assessment  Ms. Poynter is a 39 y.o. female with a  pmh significant for status post Roux-en-Y gastric bypass, hypertension, obesity, chronic alcohol use.  The GI service is consulted for evaluation and management of anemia with prior history of rectal bleeding as well as pancreatitis.  The etiology of the patient's pancreatitis is not clear.  Although alcohol seems to be the most likely factor, with that being said, we will rule out a few other things including checking triglycerides as well as her calcium levels.  Although the CT scan did not suggest that she had gallstones she seems to still have her gallbladder and she should have a ultrasound performed to evaluate whether she may have gallstones.  She has elevated liver tests of an unclear etiology and these could be due to fatty liver as being noted on the CT scan but there could be other things that could be causing this.  We will send off a basic serological work-up.  In regards to the anemia, I do think it is worthwhile for [place] to consider an endoscopic evaluation when the patient's pancreatitis is better.  But see how she is feeling into tomorrow and I will advance her diet today to clear liquids since she has not had anything all day today.  Does not seem to have evidence of severe pancreatitis but we will have to see how her labs look into tomorrow.  Inflammatory markers should be drawn on day 1 and day 3 I will try to add them on today and if not they will be drawn tomorrow.  The risks and benefits of endoscopic evaluation were discussed with the patient; these include but are not limited to the risk of perforation, infection, bleeding, missed lesions, lack of diagnosis, severe illness requiring hospitalization, as well as anesthesia and sedation related illnesses.  The patient is agreeable to proceed when we feel she is safe potentially on Monday versus Tuesday if her pancreatitis is significantly  improving.   Plan/Recommendations  Obtain ESR/CRP Obtain triglycerides Obtain ionized calcium In the  next 1 to 2 days obtain a right upper quadrant ultrasound to evaluate for gallstone disease Clear liquid diet today She has been doing tomorrow If patient having clinical improvement will consider endoscopic evaluation with EGD/colonoscopy as had previously been the plan as an outpatient so she did not follow-up on this Patient needs to be given vitamin B12 due to her insufficiency Patient needs to be given folate due to her insufficiency Further work-up as per medicine service in regards to IUD issues noted on CT   Thank you for this consult.  We will continue to follow.  Please page/call with questions or concerns.   Justice Britain, MD [facility] Office # [phone]

## 2019-04-27 NOTE — Progress Notes (Addendum)
New Admission Note: ? Arrival Method: Stretcher Mental Orientation: Alert and Oriented x4 Telemetry: Box 19 Assessment: Completed Skin: Refer to flowsheet IV: Right forearm and left forearm  Pain: 0/10 Tubes: None Safety Measures: Safety Fall Prevention Plan discussed with patient. Admission: Completed [facility] [place] Orientation: Patient has been orientated to the room, unit and the staff. Family: None at the Bedside a Orders have been reviewed and are being implemented. Will continue to monitor the patient. Call light has been placed within reach and bed alarm has been activated.  ?Donia Guiles, RN  Phone Number: [phone]

## 2019-04-27 NOTE — Plan of Care (Signed)
  Problem: Education: Goal: Knowledge of General Education information will improve Description Including pain rating scale, medication(s)/side effects and non-pharmacologic comfort measures Outcome: Progressing   

## 2019-04-28 ENCOUNTER — Inpatient Hospital Stay (HOSPITAL_COMMUNITY): Payer: BLUE CROSS/BLUE SHIELD

## 2019-04-28 DIAGNOSIS — D509 Iron deficiency anemia, unspecified: Secondary | ICD-10-CM

## 2019-04-28 DIAGNOSIS — F1010 Alcohol abuse, uncomplicated: Secondary | ICD-10-CM

## 2019-04-28 LAB — CBC
HCT: 25.2 % — ABNORMAL LOW (ref 36.0–46.0)
Hemoglobin: 7.1 g/dL — ABNORMAL LOW (ref 12.0–15.0)
MCH: 20.7 pg — ABNORMAL LOW (ref 26.0–34.0)
MCHC: 28.2 g/dL — ABNORMAL LOW (ref 30.0–36.0)
MCV: 73.5 fL — ABNORMAL LOW (ref 80.0–100.0)
Platelets: 130 10*3/uL — ABNORMAL LOW (ref 150–400)
RBC: 3.43 MIL/uL — ABNORMAL LOW (ref 3.87–5.11)
RDW: 31.3 % — ABNORMAL HIGH (ref 11.5–15.5)
WBC: 2.3 10*3/uL — ABNORMAL LOW (ref 4.0–10.5)
nRBC: 2.2 % — ABNORMAL HIGH (ref 0.0–0.2)

## 2019-04-28 LAB — COMPREHENSIVE METABOLIC PANEL
ALT: 32 U/L (ref 0–44)
AST: 88 U/L — ABNORMAL HIGH (ref 15–41)
Albumin: 3 g/dL — ABNORMAL LOW (ref 3.5–5.0)
Alkaline Phosphatase: 60 U/L (ref 38–126)
Anion gap: 9 (ref 5–15)
BUN: <5 mg/dL — ABNORMAL LOW (ref 6–20)
CO2: 23 mmol/L (ref 22–32)
Calcium: 7.5 mg/dL — ABNORMAL LOW (ref 8.9–10.3)
Chloride: 105 mmol/L (ref 98–111)
Creatinine, Ser: 0.54 mg/dL (ref 0.44–1.00)
GFR calc Af Amer: >60 mL/min (ref >60)
GFR calc non Af Amer: >60 mL/min (ref >60)
Glucose, Bld: 106 mg/dL — ABNORMAL HIGH (ref 70–99)
Potassium: 3.1 mmol/L — ABNORMAL LOW (ref 3.5–5.1)
Sodium: 137 mmol/L (ref 135–145)
Total Bilirubin: 1 mg/dL (ref 0.3–1.2)
Total Protein: 6.2 g/dL — ABNORMAL LOW (ref 6.5–8.1)

## 2019-04-28 LAB — SEDIMENTATION RATE: Sed Rate: 5 mm/hr (ref 0–22)

## 2019-04-28 LAB — CALCIUM, IONIZED: Calcium, Ionized, Serum: 4.3 mg/dL — ABNORMAL LOW (ref 4.5–5.6)

## 2019-04-28 LAB — HEPATITIS A ANTIBODY, TOTAL: hep A Total Ab: NONREACTIVE

## 2019-04-28 LAB — C-REACTIVE PROTEIN: CRP: 0.9 mg/dL (ref <1.0)

## 2019-04-28 LAB — HEPATITIS B CORE ANTIBODY, TOTAL: Hep B Core Total Ab: NONREACTIVE

## 2019-04-28 LAB — TRIGLYCERIDES: Triglycerides: 75 mg/dL (ref <150)

## 2019-04-28 LAB — LIPASE, BLOOD: Lipase: 110 U/L — ABNORMAL HIGH (ref 11–51)

## 2019-04-28 LAB — AMYLASE: Amylase: 60 U/L (ref 28–100)

## 2019-04-28 LAB — HEPATITIS B SURFACE ANTIGEN: Hepatitis B Surface Ag: NONREACTIVE

## 2019-04-28 MED ORDER — PEG-KCL-NACL-NASULF-NA ASC-C 100 G PO SOLR
0.5000 | Freq: Once | ORAL | Status: DC
  Filled 2019-04-28: qty 1

## 2019-04-28 MED ORDER — PEG-KCL-NACL-NASULF-NA ASC-C 100 G PO SOLR
1.0000 | Freq: Once | ORAL | Status: DC
  Filled 2019-04-28: qty 1

## 2019-04-28 MED ORDER — BISACODYL 5 MG PO TBEC
5.0000 mg | DELAYED_RELEASE_TABLET | Freq: Once | ORAL | Status: AC
  Administered 2019-04-28: 5 mg via ORAL
  Filled 2019-04-28: qty 1

## 2019-04-28 MED ORDER — PEG-KCL-NACL-NASULF-NA ASC-C 100 G PO SOLR
0.5000 | Freq: Once | ORAL | Status: AC
  Administered 2019-04-28: 100 g via ORAL
  Filled 2019-04-28: qty 1

## 2019-04-28 MED ORDER — HYDROCHLOROTHIAZIDE 12.5 MG PO CAPS
12.5000 mg | ORAL_CAPSULE | Freq: Every day | ORAL | Status: DC
  Administered 2019-04-28 – 2019-04-30 (×3): 12.5 mg via ORAL
  Filled 2019-04-28 (×3): qty 1

## 2019-04-28 MED ORDER — BISACODYL 5 MG PO TBEC
5.0000 mg | DELAYED_RELEASE_TABLET | Freq: Once | ORAL | Status: AC
  Administered 2019-04-29: 5 mg via ORAL
  Filled 2019-04-28: qty 1

## 2019-04-28 MED ORDER — POTASSIUM CHLORIDE CRYS ER 20 MEQ PO TBCR
40.0000 meq | EXTENDED_RELEASE_TABLET | Freq: Four times a day (QID) | ORAL | Status: AC
  Administered 2019-04-28 (×2): 40 meq via ORAL
  Filled 2019-04-28 (×2): qty 2

## 2019-04-28 MED ORDER — FOLIC ACID 1 MG PO TABS
1.0000 mg | ORAL_TABLET | Freq: Every day | ORAL | Status: DC
  Administered 2019-04-28 – 2019-04-30 (×3): 1 mg via ORAL
  Filled 2019-04-28 (×3): qty 1

## 2019-04-28 MED ORDER — VITAMIN B-12 100 MCG PO TABS
500.0000 ug | ORAL_TABLET | Freq: Every day | ORAL | Status: DC
  Administered 2019-04-28 – 2019-04-30 (×3): 500 ug via ORAL
  Filled 2019-04-28 (×3): qty 5

## 2019-04-28 NOTE — Progress Notes (Addendum)
   [facility] Progress Note  CC:  Anemia, rectal bleeding and pancreatitis   Subjective: She is having less upper abdominal pain today. Rates pain 3 out of 10. No nausea or vomiting. Tolerating clear liquid diet. She passed a yellow watery stool early am today. No rectal bleeding.    Objective:  Vital signs in last 24 hours: Temp:  [98.5 F (36.9 C)-99.4 F (37.4 C)] 98.7 F (37.1 C) (02/07 0913) Pulse Rate:  [81-97] 81 (02/07 0913) Resp:  [15-18] 18 (02/07 0913) BP: (126-158)/(71-99) 150/96 (02/07 0913) SpO2:  [98 %-100 %] 99 % (02/07 0913) Weight:  [91.6 kg] 91.6 kg (02/06 2038) Last BM Date: 04/27/19 General:   Alert, well developed 39 year old female in NAD. Eyes: Sclera nonicteric. Conjunctiva pink.  Heart: RRR, no murmur.  Pulm:  Breath sounds clear throughout.  Abdomen: Soft, nondistended. Mild epigastric and LLQ tenderness without rebound or guarding.  Extremities:  Without edema. Neurologic:  Alert and  oriented x4;  grossly normal neurologically. Psych:  Alert and cooperative. Normal mood and affect.  Intake/Output from previous day: 02/06 0701 - 02/07 0700 In: 3748.3 [P.O.:720; I.V.:2978.3; IV Piggyback:50] Out: 950 [Urine:950] Intake/Output this shift: Total I/O In: 360 [P.O.:360] Out: 200 [Urine:200]  Lab Results: Recent Labs    04/26/19 1333 04/27/19 0209 04/28/19 0645  WBC 4.4 2.6* 2.3*  HGB 6.7* 7.2* 7.1*  HCT 24.7* 25.1* 25.2*  PLT 218 148* 130*   BMET Recent Labs    04/27/19 0209 04/27/19 1820 04/28/19 0645  NA 138 139 137  K 2.7* 3.3* 3.1*  CL 103 107 105  CO2 25 24 23  GLUCOSE 125* 110* 106*  BUN <5* <5* <5*  CREATININE 0.56 0.56 0.54  CALCIUM 7.4* 7.5* 7.5*   LFT Recent Labs    04/26/19 1545 04/27/19 0209 04/28/19 0645  PROT 7.0   < > 6.2*  ALBUMIN 3.6   < > 3.0*  AST 138*   < > 88*  ALT 46*   < > 32  ALKPHOS 72   < > 60  BILITOT 1.2   < > 1.0  BILIDIR 0.3*  --   --   IBILI 0.9  --   --    < > =  values in this interval not displayed.   PT/INR No results for input(s): LABPROT, INR in the last 72 hours. Hepatitis Panel Recent Labs    04/28/19 0645  HEPBSAG NON REACTIVE    CT ABDOMEN PELVIS W CONTRAST  Result Date: 04/26/2019 CLINICAL DATA:  Cholecystitis.  Dizziness and shortness of breath. EXAM: CT ABDOMEN AND PELVIS WITH CONTRAST TECHNIQUE: Multidetector CT imaging of the abdomen and pelvis was performed using the standard protocol following bolus administration of intravenous contrast. CONTRAST:  100mL OMNIPAQUE IOHEXOL 300 MG/ML  SOLN COMPARISON:  None. FINDINGS: Lower chest:  No contributory findings. Hepatobiliary: Hepatic steatosis.No evidence of biliary obstruction or stone. Pancreas: Fat stranding around the head and uncinate process tracking into the mesentery. There is correlative elevated lipase. No ductal dilatation or collection. Spleen: Unremarkable. Adrenals/Urinary Tract: Negative adrenals. No hydronephrosis or stone. Unremarkable bladder. Stomach/Bowel:  No obstruction. No appendicitis.  Gastric bypass. Vascular/Lymphatic: Mild atherosclerotic plaque. Venous findings noted below. No mass or adenopathy. Reproductive:Malpositioned IUD which is rotated with the side-arm through the lower uterine segment that approaches the serosa. The stem is at the level of the uterine and cervical junction. Nonobstructive filling defect the level of the mid right ovarian vein, see coronal reformats, with some   regional fat stranding. The ovaries themselves appear symmetric. Other: No ascites or pneumoperitoneum. Musculoskeletal: No acute abnormalities.  Spondylosis. IMPRESSION: 1. Mild acute edematous pancreatitis. 2. 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 3. Malpositioned IUD which is low and rotated with 1 of the side arms penetrating the lower uterine segment. 4. Hepatic steatosis. 5. Gastric bypass. Electronically Signed   By: Jonathon  Watts M.D.   On: 04/26/2019 18:55   DG Abd  Portable 1 View  Result Date: 04/26/2019 CLINICAL DATA:  Abdominal pain.  Emesis. EXAM: PORTABLE ABDOMEN - 1 VIEW COMPARISON:  None FINDINGS: Mid abdominal small bowel loop measures up to 3.4 cm. Right hemidiaphragm elevation. No free intraperitoneal air. Surgical sutures in the region of the stomach, consistent with Roux-en-Y gastric bypass. No abnormal abdominal calcifications. No appendicolith. Pelvic calcifications are most likely phleboliths. Intrauterine device. IMPRESSION: Nonspecific loop of borderline dilated mid abdominal small bowel. No specific evidence of bowel obstruction and no evidence of free intraperitoneal air. Electronically Signed   By: Kyle  Talbot M.D.   On: 04/26/2019 16:29    Assessment / Plan:  1. 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 CTAP identified acute pancreatitis and hepatic steatosis. Most likely EtOH pancreatitis. Lipase 110. TGs 75. AST 88. ALT 32. Alk phos 60. T. Bili 1.0. CRP 0.9. Sed Rate 5. Hep B surface ab negative. Hep B surf ab, Hep B core total ab, Hep C ab reflex and Hep A total antibody levels ordered.  -Abdominal sonogram to assess for gallstones -Continue clear liquid diet. -EGD tomorrow -Continue NS IV  @ 125cc/hr -Ondansetron 4mg IV Q 6hrs PRN -Pain management per the hospitalist  -check ANA, SMA, AMA, ceruloplasmin, IgG, A1AT, PT/INR in am  2. Rectal bleeding -Colonoscopy with EGD tomorrow  3. Pancytopenia. WBC 2.3. RBC 3.43. Hg 7.1. HCT 25.2. MCV 73.5. PLT 130. Iron 196. CTAP showed a normal spleen.  CTAP showed hepatic steatosis without evidence of cirrhosis. Normal spleen.   4. Hypokalemia. K+ 3.1.  KCL replacement per the hospitalist   5. CTAP identified partial thrombosis of right ovarian vein  Further recommendations per Dr. Mansouraty     Principal Problem:   Acute alcoholic pancreatitis Active Problems:   Anemia due to GI blood loss    Malpositioned IUD   Thrombosis of ovarian vein   Hypokalemia     LOS: 2 days   Laura Hunter  04/28/2019, 12:11 PM     

## 2019-04-28 NOTE — Progress Notes (Signed)
[facility]                                                                              Patient Demographics  Laura Hunter, is a 39 y.o. female, DOB - Nov 25, 1980, EPP:[id number]  Admit date - 04/26/2019   Admitting Physician Hillary Bow, DO  Outpatient Primary MD for the patient is Patient, No Pcp Per  Outpatient specialists:   LOS - 2  days    Chief Complaint  Patient presents with  . Dizziness       Brief summary  Laura Hunter is a 39 y.o. female with medical history significant for HTN, obesity s/p Roux-en-y 2012 presenting with 3-week history of progressively worsening abdominal pain associated with generalized weakness and dyspnea on exertion.  ED evaluation noted AST of 138, ALT 48, potassium 2.5, hemoglobin 6.7, lipase 157, and abdominal CT noted acute edematous pancreatitis.  Patient gives remote history of melanotic stools 6 months ago for which was supposed to get colonoscopy but never got.  She drinks about 6 shots of rum a few times a week   Assessment & Plan    Principal Problem:   Acute alcoholic pancreatitis Active Problems:   Anemia due to GI blood loss   Malpositioned IUD   Thrombosis of ovarian vein   Hypokalemia  Acute alcoholic pancreatitis Abdomen CT noted pancreatic edema without concerns for bladder obstruction or stones History of alcohol abuse Bowel rest IV fluids Analgesic support with antinauseants/antiemetics as needed Monitor hepatic enzymes with LFTs  Anemia due to chronic GI bleeding Hemoglobin 6.7 on admission PRBC transfusion given on admission Patient had been taking NSAIDs-consult to stop use History of melanotic stools Was planned for colonoscopy-but this was not done No evidence of acute ongoing GI loss Monitor hematologic indicis and transfuse as needed [facility] consulted -plan for EGD with colonoscopy this hospitalization Appreciate GI consultation report reviewed and noted  Hypokalemia  Potassium repletion ordered with follow-up potassium and magnesium  Malpositioned IUD and partial thrombosis of ovarian vein Noted per CT scan No clinical evidence of correlating symptomatology No blood thinners for now due to chronic GI bleed -would hold till post endoscopic evaluation to decide Possible gynecology consultation as outpatient  Code Status: Full code DVT Prophylaxis:   SCD's Family Communication: Discussed in detail with the patient, all imaging results, lab results explained to the patient   Disposition Plan: Home  Time Spent in minutes 25 minutes  Procedures:    Consultants:   Gastroenterology  Antimicrobials:      Medications  Scheduled Meds: . amLODipine  5 mg Oral Daily  . folic acid  1 mg Oral Daily  . potassium chloride  40 mEq Oral Q6H  . vitamin B-12  500 mcg Oral Daily   Continuous Infusions: . sodium chloride 125 mL/hr at 04/28/19 0400   PRN Meds:.acetaminophen **OR** acetaminophen, cyclobenzaprine, morphine injection, ondansetron **OR** ondansetron (ZOFRAN) IV   Antibiotics   Anti-infectives (From admission, onward)   None        Subjective:   Laura Hunter was seen and examined today.  No acute events reported overnight.  Denies any hematochezia.  No shortness of breath Objective:   Vitals:   04/27/19 1717 04/27/19 2038 04/28/19 0445 04/28/19 0913  BP: (!) 155/98 (!) 158/99 126/71 (!) 150/96  Pulse: 87 97 89 81  Resp: 18 15 16 18   Temp: 98.7 F (37.1 C) 99.4 F (37.4 C) 98.5 F (36.9 C) 98.7 F (37.1 C)  TempSrc: Oral Oral Oral Oral  SpO2: 100% 98% 98% 99%  Weight:  91.6 kg    Height:        Intake/Output Summary (Last 24 hours) at 04/28/2019 1147 Last data filed at 04/28/2019 1031 Gross per 24 hour  Intake 4108.27 ml  Output 750 ml  Net 3358.27 ml     Wt Readings from Last 3 Encounters:  04/27/19 91.6 kg  09/20/18 93 kg     Exam  General: NAD  HEENT: NCAT,  PERRL,MMM  Neck: SUPPLE, (-) JVD   Cardiovascular: RRR, (-) GALLOP, (-) MURMUR  Respiratory: CTA  Gastrointestinal: SOFT, (-) DISTENSION, BS(+), (+) epigastric TENDERNESS  Ext: (-) CYANOSIS, (-) EDEMA  Neuro: A, OX 3  Skin:(-) RASH     Data Reviewed:  I have personally reviewed following labs and imaging studies  Micro Results Recent Results (from the past 240 hour(s))  SARS CORONAVIRUS 2 (TAT 6-24 HRS) Nasopharyngeal Nasopharyngeal Swab     Status: None   Collection Time: 04/26/19  7:27 PM   Specimen: Nasopharyngeal Swab  Result Value Ref Range Status   SARS Coronavirus 2 NEGATIVE NEGATIVE Final    Comment: (NOTE) SARS-CoV-2 target nucleic acids are NOT DETECTED. The SARS-CoV-2 RNA is generally detectable in upper and lower respiratory specimens during the acute phase of infection. Negative results do not preclude SARS-CoV-2 infection, do not rule out co-infections with other pathogens, and should not be used as the sole basis for treatment or other patient management decisions. Negative results must be combined with clinical observations, patient history, and epidemiological information. The expected result is Negative. Fact Sheet for Patients: SugarRoll.be Fact Sheet for Healthcare Providers: https://www.woods-mathews.com/ This test is not yet approved or cleared by the [place] FDA and  has been authorized for detection and/or diagnosis of SARS-CoV-2 by FDA under an Emergency Use Authorization (EUA). This EUA will remain  in effect (meaning this test can be used) for the duration of the COVID-19 declaration under Section 56 4(b)(1) of the Act, 21 U.S.C. section 360bbb-3(b)(1), unless the authorization is terminated or revoked sooner. Performed at [facility], [place] [address] [place], [place] [address]     Radiology Reports CT ABDOMEN PELVIS W CONTRAST  Result Date: 04/26/2019 CLINICAL DATA:  Cholecystitis.  Dizziness and shortness of breath.  EXAM: CT ABDOMEN AND PELVIS WITH CONTRAST TECHNIQUE: Multidetector CT imaging of the abdomen and pelvis was performed using the standard protocol following bolus administration of intravenous contrast. CONTRAST:  137mL OMNIPAQUE IOHEXOL 300 MG/ML  SOLN COMPARISON:  None. FINDINGS: Lower chest:  No contributory findings. Hepatobiliary: Hepatic steatosis.No evidence of biliary obstruction or stone. Pancreas: Fat stranding around the head and uncinate process tracking into the mesentery. There is correlative elevated lipase. No ductal dilatation or collection. Spleen: Unremarkable. Adrenals/Urinary Tract: Negative adrenals. No hydronephrosis or stone. Unremarkable bladder. Stomach/Bowel:  No obstruction. No appendicitis.  Gastric bypass. Vascular/Lymphatic: Mild atherosclerotic plaque. Venous findings noted below. No mass or adenopathy. Reproductive:Malpositioned IUD which is rotated with the side-arm through the lower uterine segment that approaches the serosa. The stem is at the level of the uterine and cervical junction. Nonobstructive filling defect the level  of the mid right ovarian vein, see coronal reformats, with some regional fat stranding. The ovaries themselves appear symmetric. Other: No ascites or pneumoperitoneum. Musculoskeletal: No acute abnormalities.  Spondylosis. IMPRESSION: 1. Mild acute edematous pancreatitis. 2. 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 3. Malpositioned IUD which is low and rotated with 1 of the side arms penetrating the lower uterine segment. 4. Hepatic steatosis. 5. Gastric bypass. Electronically Signed   By: Marnee Spring M.D.   On: 04/26/2019 18:55   DG Abd Portable 1 View  Result Date: 04/26/2019 CLINICAL DATA:  Abdominal pain.  Emesis. EXAM: PORTABLE ABDOMEN - 1 VIEW COMPARISON:  None FINDINGS: Mid abdominal small bowel loop measures up to 3.4 cm. Right hemidiaphragm elevation. No free intraperitoneal air. Surgical sutures in the region of the stomach, consistent  with Roux-en-Y gastric bypass. No abnormal abdominal calcifications. No appendicolith. Pelvic calcifications are most likely phleboliths. Intrauterine device. IMPRESSION: Nonspecific loop of borderline dilated mid abdominal small bowel. No specific evidence of bowel obstruction and no evidence of free intraperitoneal air. Electronically Signed   By: Jeronimo Greaves M.D.   On: 04/26/2019 16:29    Lab Data:  CBC: Recent Labs  Lab 04/26/19 1333 04/26/19 1545 04/27/19 0209 04/28/19 0645  WBC 4.4  --  2.6* 2.3*  NEUTROABS  --  2.7  --   --   HGB 6.7*  --  7.2* 7.1*  HCT 24.7*  --  25.1* 25.2*  MCV 70.8*  --  71.9* 73.5*  PLT 218  --  148* 130*   Basic Metabolic Panel: Recent Labs  Lab 04/26/19 1333 04/26/19 1545 04/27/19 0209 04/27/19 1820 04/28/19 0645  NA 138  --  138 139 137  K 2.5*  --  2.7* 3.3* 3.1*  CL 99  --  103 107 105  CO2 24  --  25 24 23   GLUCOSE 113*  --  125* 110* 106*  BUN <5*  --  <5* <5* <5*  CREATININE 0.61  --  0.56 0.56 0.54  CALCIUM 7.8*  --  7.4* 7.5* 7.5*  MG  --  1.1*  --  1.9  --    GFR: Estimated Creatinine Clearance: 98.4 mL/min (by C-G formula based on SCr of 0.54 mg/dL). Liver Function Tests: Recent Labs  Lab 04/26/19 1545 04/27/19 0209 04/28/19 0645  AST 138* 117* 88*  ALT 46* 40 32  ALKPHOS 72 72 60  BILITOT 1.2 1.1 1.0  PROT 7.0 6.5 6.2*  ALBUMIN 3.6 3.3* 3.0*   Recent Labs  Lab 04/26/19 1545 04/28/19 0645  LIPASE 157* 110*  AMYLASE  --  60   No results for input(s): AMMONIA in the last 168 hours. Coagulation Profile: No results for input(s): INR, PROTIME in the last 168 hours. Cardiac Enzymes: No results for input(s): CKTOTAL, CKMB, CKMBINDEX, TROPONINI in the last 168 hours. BNP (last 3 results) No results for input(s): PROBNP in the last 8760 hours. HbA1C: No results for input(s): HGBA1C in the last 72 hours. CBG: Recent Labs  Lab 04/26/19 1508  GLUCAP 114*   Lipid Profile: Recent Labs    04/28/19 0645  TRIG 75    Thyroid Function Tests: No results for input(s): TSH, T4TOTAL, FREET4, T3FREE, THYROIDAB in the last 72 hours. Anemia Panel: Recent Labs    04/26/19 1545  VITAMINB12 130*  FOLATE 5.5*  FERRITIN 23  TIBC 421  IRON 196*  RETICCTPCT 0.9   Urine analysis:    Component Value Date/Time   COLORURINE YELLOW 04/26/2019 1510  APPEARANCEUR CLEAR 04/26/2019 1510   LABSPEC 1.011 04/26/2019 1510   PHURINE 6.0 04/26/2019 1510   GLUCOSEU NEGATIVE 04/26/2019 1510   HGBUR SMALL (A) 04/26/2019 1510   BILIRUBINUR NEGATIVE 04/26/2019 1510   KETONESUR NEGATIVE 04/26/2019 1510   PROTEINUR NEGATIVE 04/26/2019 1510   NITRITE NEGATIVE 04/26/2019 1510   LEUKOCYTESUR NEGATIVE 04/26/2019 1510     Jackie Plum M.D. [facility] 04/28/2019, 11:47 AM  Pager: [phone] Between 7am to 7pm - call Pager - [phone]  After 7pm go to [email or web address] - password [email or web address]  Call night coverage person covering after 7pm

## 2019-04-28 NOTE — Plan of Care (Signed)
  Problem: Clinical Measurements: Goal: Complications related to the disease process, condition or treatment will be avoided or minimized Outcome: Progressing   

## 2019-04-28 NOTE — H&P (View-Only) (Signed)
[facility] Progress Note  CC:  Anemia, rectal bleeding and pancreatitis   Subjective: She is having less upper abdominal pain today. Rates pain 3 out of 10. No nausea or vomiting. Tolerating clear liquid diet. She passed a yellow watery stool early am today. No rectal bleeding.    Objective:  Vital signs in last 24 hours: Temp:  [98.5 F (36.9 C)-99.4 F (37.4 C)] 98.7 F (37.1 C) (02/07 0913) Pulse Rate:  [81-97] 81 (02/07 0913) Resp:  [15-18] 18 (02/07 0913) BP: (126-158)/(71-99) 150/96 (02/07 0913) SpO2:  [98 %-100 %] 99 % (02/07 0913) Weight:  [91.6 kg] 91.6 kg (02/06 2038) Last BM Date: 04/27/19 General:   Alert, well developed 39 year old female in NAD. Eyes: Sclera nonicteric. Conjunctiva pink.  Heart: RRR, no murmur.  Pulm:  Breath sounds clear throughout.  Abdomen: Soft, nondistended. Mild epigastric and LLQ tenderness without rebound or guarding.  Extremities:  Without edema. Neurologic:  Alert and  oriented x4;  grossly normal neurologically. Psych:  Alert and cooperative. Normal mood and affect.  Intake/Output from previous day: 02/06 0701 - 02/07 0700 In: 3748.3 [P.O.:720; I.V.:2978.3; IV Piggyback:50] Out: 950 [Urine:950] Intake/Output this shift: Total I/O In: 360 [P.O.:360] Out: 200 [Urine:200]  Lab Results: Recent Labs    04/26/19 1333 04/27/19 0209 04/28/19 0645  WBC 4.4 2.6* 2.3*  HGB 6.7* 7.2* 7.1*  HCT 24.7* 25.1* 25.2*  PLT 218 148* 130*   BMET Recent Labs    04/27/19 0209 04/27/19 1820 04/28/19 0645  NA 138 139 137  K 2.7* 3.3* 3.1*  CL 103 107 105  CO2 _0 GLUCOSE 125* 110* 106*  BUN <5* <5* <5*  CREATININE 0.56 0.56 0.54  CALCIUM 7.4* 7.5* 7.5*   LFT Recent Labs    04/26/19 1545 04/27/19 0209 04/28/19 0645  PROT 7.0   < > 6.2*  ALBUMIN 3.6   < > 3.0*  AST 138*   < > 88*  ALT 46*   < > 32  ALKPHOS 72   < > 60  BILITOT 1.2   < > 1.0  BILIDIR 0.3*  --   --   IBILI 0.9  --   --    < > =  values in this interval not displayed.   PT/INR No results for input(s): LABPROT, INR in the last 72 hours. Hepatitis Panel Recent Labs    04/28/19 0645  HEPBSAG NON REACTIVE    CT ABDOMEN PELVIS W CONTRAST  Result Date: 04/26/2019 CLINICAL DATA:  Cholecystitis.  Dizziness and shortness of breath. EXAM: CT ABDOMEN AND PELVIS WITH CONTRAST TECHNIQUE: Multidetector CT imaging of the abdomen and pelvis was performed using the standard protocol following bolus administration of intravenous contrast. CONTRAST:  177m OMNIPAQUE IOHEXOL 300 MG/ML  SOLN COMPARISON:  None. FINDINGS: Lower chest:  No contributory findings. Hepatobiliary: Hepatic steatosis.No evidence of biliary obstruction or stone. Pancreas: Fat stranding around the head and uncinate process tracking into the mesentery. There is correlative elevated lipase. No ductal dilatation or collection. Spleen: Unremarkable. Adrenals/Urinary Tract: Negative adrenals. No hydronephrosis or stone. Unremarkable bladder. Stomach/Bowel:  No obstruction. No appendicitis.  Gastric bypass. Vascular/Lymphatic: Mild atherosclerotic plaque. Venous findings noted below. No mass or adenopathy. Reproductive:Malpositioned IUD which is rotated with the side-arm through the lower uterine segment that approaches the serosa. The stem is at the level of the uterine and cervical junction. Nonobstructive filling defect the level of the mid right ovarian vein, see coronal reformats, with some  regional fat stranding. The ovaries themselves appear symmetric. Other: No ascites or pneumoperitoneum. Musculoskeletal: No acute abnormalities.  Spondylosis. IMPRESSION: 1. Mild acute edematous pancreatitis. 2. 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 3. Malpositioned IUD which is low and rotated with 1 of the side arms penetrating the lower uterine segment. 4. Hepatic steatosis. 5. Gastric bypass. Electronically Signed   By: Monte Fantasia M.D.   On: 04/26/2019 18:55   DG Abd  Portable 1 View  Result Date: 04/26/2019 CLINICAL DATA:  Abdominal pain.  Emesis. EXAM: PORTABLE ABDOMEN - 1 VIEW COMPARISON:  None FINDINGS: Mid abdominal small bowel loop measures up to 3.4 cm. Right hemidiaphragm elevation. No free intraperitoneal air. Surgical sutures in the region of the stomach, consistent with Roux-en-Y gastric bypass. No abnormal abdominal calcifications. No appendicolith. Pelvic calcifications are most likely phleboliths. Intrauterine device. IMPRESSION: Nonspecific loop of borderline dilated mid abdominal small bowel. No specific evidence of bowel obstruction and no evidence of free intraperitoneal air. Electronically Signed   By: Abigail Miyamoto M.D.   On: 04/26/2019 16:29    Assessment / Plan:  42. 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 CTAP identified acute pancreatitis and hepatic steatosis. Most likely EtOH pancreatitis. Lipase 110. TGs 75. AST 88. ALT 32. Alk phos 60. T. Bili 1.0. CRP 0.9. Sed Rate 5. Hep B surface ab negative. Hep B surf ab, Hep B core total ab, Hep C ab reflex and Hep A total antibody levels ordered.  -Abdominal sonogram to assess for gallstones -Continue clear liquid diet. -EGD tomorrow -Continue NS IV  @ 125cc/hr -Ondansetron 38m IV Q 6hrs PRN -Pain management per the hospitalist  -check ANA, SMA, AMA, ceruloplasmin, IgG, A1AT, PT/INR in am  2. Rectal bleeding -Colonoscopy with EGD tomorrow  3. Pancytopenia. WBC 2.3. RBC 3.43. Hg 7.1. HCT 25.2. MCV 73.5. PLT 130. Iron 196. CTAP showed a normal spleen.  CTAP showed hepatic steatosis without evidence of cirrhosis. Normal spleen.   4. Hypokalemia. K+ 3.1.  KCL replacement per the hospitalist   5. CTAP identified partial thrombosis of right ovarian vein  Further recommendations per Dr. MRush Landmark    Principal Problem:   Acute alcoholic pancreatitis Active Problems:   Anemia due to GI blood loss    Malpositioned IUD   Thrombosis of ovarian vein   Hypokalemia     LOS: 2 days   CNoralyn Pick 04/28/2019, 12:11 PM

## 2019-04-29 ENCOUNTER — Inpatient Hospital Stay (HOSPITAL_COMMUNITY): Payer: BLUE CROSS/BLUE SHIELD | Admitting: Anesthesiology

## 2019-04-29 ENCOUNTER — Encounter (HOSPITAL_COMMUNITY)
Admission: EM | Disposition: A | Discharge status: Home / Self Care | Payer: Self-pay | Source: Home / Self Care | Attending: Internal Medicine

## 2019-04-29 ENCOUNTER — Encounter (HOSPITAL_COMMUNITY): Payer: Self-pay | Admitting: Internal Medicine

## 2019-04-29 DIAGNOSIS — D509 Iron deficiency anemia, unspecified: Secondary | ICD-10-CM

## 2019-04-29 DIAGNOSIS — K921 Melena: Secondary | ICD-10-CM

## 2019-04-29 HISTORY — PX: HOT HEMOSTASIS: SHX5433

## 2019-04-29 HISTORY — PX: COLONOSCOPY WITH PROPOFOL: SHX5780

## 2019-04-29 HISTORY — PX: ESOPHAGOGASTRODUODENOSCOPY (EGD) WITH PROPOFOL: SHX5813

## 2019-04-29 LAB — COMPREHENSIVE METABOLIC PANEL
ALT: 33 U/L (ref 0–44)
AST: 91 U/L — ABNORMAL HIGH (ref 15–41)
Albumin: 3.3 g/dL — ABNORMAL LOW (ref 3.5–5.0)
Alkaline Phosphatase: 60 U/L (ref 38–126)
Anion gap: 8 (ref 5–15)
BUN: <5 mg/dL — ABNORMAL LOW (ref 6–20)
CO2: 22 mmol/L (ref 22–32)
Calcium: 8.2 mg/dL — ABNORMAL LOW (ref 8.9–10.3)
Chloride: 107 mmol/L (ref 98–111)
Creatinine, Ser: 0.47 mg/dL (ref 0.44–1.00)
GFR calc Af Amer: >60 mL/min (ref >60)
GFR calc non Af Amer: >60 mL/min (ref >60)
Glucose, Bld: 98 mg/dL (ref 70–99)
Potassium: 3.2 mmol/L — ABNORMAL LOW (ref 3.5–5.1)
Sodium: 137 mmol/L (ref 135–145)
Total Bilirubin: 0.8 mg/dL (ref 0.3–1.2)
Total Protein: 6.7 g/dL (ref 6.5–8.1)

## 2019-04-29 LAB — CBC
HCT: 25.2 % — ABNORMAL LOW (ref 36.0–46.0)
Hemoglobin: 7.2 g/dL — ABNORMAL LOW (ref 12.0–15.0)
MCH: 20.8 pg — ABNORMAL LOW (ref 26.0–34.0)
MCHC: 28.6 g/dL — ABNORMAL LOW (ref 30.0–36.0)
MCV: 72.8 fL — ABNORMAL LOW (ref 80.0–100.0)
Platelets: 112 10*3/uL — ABNORMAL LOW (ref 150–400)
RBC: 3.46 MIL/uL — ABNORMAL LOW (ref 3.87–5.11)
RDW: 31.1 % — ABNORMAL HIGH (ref 11.5–15.5)
WBC: 2.6 10*3/uL — ABNORMAL LOW (ref 4.0–10.5)
nRBC: 15.7 % — ABNORMAL HIGH (ref 0.0–0.2)

## 2019-04-29 SURGERY — ESOPHAGOGASTRODUODENOSCOPY (EGD) WITH PROPOFOL
Anesthesia: Monitor Anesthesia Care

## 2019-04-29 MED ORDER — PROPOFOL 500 MG/50ML IV EMUL
INTRAVENOUS | Status: DC | PRN
  Administered 2019-04-29: 40 mg via INTRAVENOUS
  Administered 2019-04-29 (×2): 20 mg via INTRAVENOUS
  Administered 2019-04-29: 10 mg via INTRAVENOUS
  Administered 2019-04-29 (×2): 40 mg via INTRAVENOUS
  Administered 2019-04-29: 30 mg via INTRAVENOUS

## 2019-04-29 MED ORDER — POTASSIUM CHLORIDE CRYS ER 20 MEQ PO TBCR
40.0000 meq | EXTENDED_RELEASE_TABLET | Freq: Once | ORAL | Status: AC
  Administered 2019-04-29: 40 meq via ORAL
  Filled 2019-04-29: qty 2

## 2019-04-29 MED ORDER — FAMOTIDINE 20 MG PO TABS
40.0000 mg | ORAL_TABLET | Freq: Every day | ORAL | Status: DC
  Administered 2019-04-29 – 2019-04-30 (×2): 40 mg via ORAL
  Filled 2019-04-29 (×2): qty 2

## 2019-04-29 MED ORDER — SODIUM CHLORIDE 0.9 % IV SOLN: INTRAVENOUS | Status: DC

## 2019-04-29 MED ORDER — POTASSIUM CHLORIDE 10 MEQ/100ML IV SOLN
10.0000 meq | INTRAVENOUS | Status: AC
  Administered 2019-04-29: 10 meq via INTRAVENOUS
  Filled 2019-04-29: qty 100

## 2019-04-29 MED ORDER — SODIUM CHLORIDE 0.9 % IV SOLN: INTRAVENOUS | Status: DC | PRN

## 2019-04-29 MED ORDER — LIDOCAINE 2% (20 MG/ML) 5 ML SYRINGE
INTRAMUSCULAR | Status: DC | PRN
  Administered 2019-04-29: 100 mg via INTRAVENOUS

## 2019-04-29 MED ORDER — PROPOFOL 500 MG/50ML IV EMUL
INTRAVENOUS | Status: DC | PRN
  Administered 2019-04-29: 150 ug/kg/min via INTRAVENOUS
  Administered 2019-04-29: 200 ug/kg/min via INTRAVENOUS

## 2019-04-29 SURGICAL SUPPLY — 25 items

## 2019-04-29 NOTE — Op Note (Signed)
[facility] Patient Name: Laura Hunter Procedure Date : 04/29/2019 MRN: [id number] Attending MD: Meryl Dare , MD Date of Birth: 01-11-1981 CSN: [id number] Age: 39 Admit Type: Inpatient Procedure:                Upper GI endoscopy Indications:              Iron deficiency anemia Providers:                Venita Lick. Russella Dar, MD, Margaree Mackintosh, RN,                            Everardo Pacific, Technician, Marc Morgans, Technician Referring MD:             [facility] Medicines:                Monitored Anesthesia Care Complications:            No immediate complications. Estimated Blood Loss:     Estimated blood loss was minimal. Procedure:                Pre-Anesthesia Assessment:                           - Prior to the procedure, a History and Physical                            was performed, and patient medications and                            allergies were reviewed. The patient's tolerance of                            previous anesthesia was also reviewed. The risks                            and benefits of the procedure and the sedation                            options and risks were discussed with the patient.                            All questions were answered, and informed consent                            was obtained. Prior Anticoagulants: The patient has                            taken no previous anticoagulant or antiplatelet                            agents. ASA Grade Assessment: II - A patient with                            mild systemic disease. After reviewing the risks  and benefits, the patient was deemed in                            satisfactory condition to undergo the procedure.                           After obtaining informed consent, the endoscope was                            passed under direct vision. Throughout the                            procedure, the patient's blood pressure,  pulse, and                            oxygen saturations were monitored continuously. The                            GIF-H190 ([id number]) Olympus gastroscope was                            introduced through the mouth, and advanced to the                            afferent and efferent jejunal loops. The upper GI                            endoscopy was accomplished without difficulty. The                            patient tolerated the procedure well. Scope In: Scope Out: Findings:      The examined esophagus was normal.      Evidence of a gastric bypass was found. A gastric pouch with a 5 cm       length from the GE junction to the gastrojejunal anastomosis was found.       The gastrojejunal anastomosis was characterized by healthy appearing       mucosa however the anastomosis was friable with one focal area of       persistent oozing which was likely scope induced. APC with hemostasis       acheived. This anastomosis was traversed. The afferent limb appeared       normal. The examined portion of the efferent limb appeared normal. Impression:               - Normal esophagus.                           - Gastric bypass with a pouch 5 cm in length.                            Gastrojejunal anastomosis characterized by healthy                            appearing mucosa however mucosa was friable with  one focal area with persistent oozing. Hemostasis                            acheived with APC.                           - Examined portions of afferent and efferent limbs                            appeared normal.                           - No specimens collected. Recommendation:           - Return patient to hospital ward for ongoing care.                           - Clear liquid diet.                           - Continue present medications.                           - Avoid/minimize aspirin, ibuprofen, naproxen, or                            other  non-steroidal anti-inflammatory drugs long                            term.                           - Famotidine 40 mg po qd.                           - Anemia due to poor absorption post gastric bypass.                           - Long term iron, B12 and folate replacement post                            gastric bypass followed by PCP.                           - GI follow up with Dr. Thornton Park. 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 All rights reserved. The codes documented in this report are preliminary and upon coder review may  be revised to meet current compliance requirements. Meryl Dare, MD 04/29/2019 12:42:35 PM This report has been signed electronically. Number of Addenda: 0

## 2019-04-29 NOTE — Progress Notes (Signed)
PROGRESS NOTE    Laura Hunter  WIO:[id number] DOB: Nov 02, 1980 DOA: 04/26/2019 PCP: Patient, No Pcp Per   Brief Narrative: Patient is a 39 year old female with history of chronic alcohol abuse, hypertension, obesity status post gastric bypass surgery who presented with 3-week history of progressive worsening abdominal pain, generalized weakness, nausea, vomiting.  When she presented her liver enzymes were elevated, elevated lipase.  Abdominal CT showed acute edematous pancreatitis.  Started on conservative management.  She also gave history of melanotic stools and her hemoglobin was low.  GI consulted.  Plan for EGD/colonoscopy today.  Abdomen pain has improved.  Assessment & Plan:   Principal Problem:   Acute alcoholic pancreatitis Active Problems:   Anemia due to GI blood loss   Malpositioned IUD   Thrombosis of ovarian vein   Hypokalemia   Acute pancreatitis: Most likely associated with alcohol.  CT abdomen showed pancreatic edema, no gallstones.  Has history of chronic alcohol abuse.  Drinks hard liquor.  Abdomen pain has improved.  Continue IV fluids, bowel rest, antiemetics.  Liver enzymes improving. Ultrasound of the right upper quadrant showed hepatic steatosis.  Acute on chronic GI bleed: Hemoglobin of 6.7 on admission.  She was transfused with PRBC. Marland Kitchen she was also taking NSAIDs.  She has history of melanotic stools.  GI consulted.  Plan for EGD/consulted today.  Pancytopenia: She has pancytopenia picture which could be associated  with chronic alcohol abuse.She has severe microcytic anemia.  Folic acid and vitamin B12 are low which will supplement.  Iron studies showed normal iron.  Hypokalemia: Being supplemented with potassium.  Malpositioned IUD/partial thrombosis of ovarian vein: Denies any lower abdominal pain.  She needs to follow-up with GYN as an outpatient.         DVT prophylaxis:SCD Code Status: Full Family Communication: None present at the  bedside Disposition Plan: Likely home tomorrow   Consultants: GI  Procedures: None  Antimicrobials:  Anti-infectives (From admission, onward)   None      Subjective: Patient seen and examined at the bedside this morning.  Hemodynamically stable.  Denies any abdomen pain, nausea or vomiting.  Waiting for procedure  Objective: Vitals:   04/28/19 0913 04/28/19 1657 04/28/19 2132 04/29/19 0503  BP: (!) 150/96 (!) 155/96 (!) 153/80 (!) 144/86  Pulse: 81 85 75 78  Resp: 18 18 16 18   Temp: 98.7 F (37.1 C) 99.4 F (37.4 C) 99.2 F (37.3 C) 98.8 F (37.1 C)  TempSrc: Oral Oral Oral Oral  SpO2: 99% 99% 100% 100%  Weight:   92.5 kg   Height:        Intake/Output Summary (Last 24 hours) at 04/29/2019 0848 Last data filed at 04/29/2019 3845 Gross per 24 hour  Intake 3681.54 ml  Output 800 ml  Net 2881.54 ml   Filed Weights   04/26/19 2252 04/27/19 2038 04/28/19 2132  Weight: 90.6 kg 91.6 kg 92.5 kg    Examination:  General exam: Appears calm and comfortable ,Not in distress, obese HEENT:PERRL,Oral mucosa moist, Ear/Nose normal on gross exam Respiratory system: Bilateral equal air entry, normal vesicular breath sounds, no wheezes or crackles  Cardiovascular system: S1 & S2 heard, RRR. No JVD, murmurs, rubs, gallops or clicks. No pedal edema. Gastrointestinal system: Abdomen is nondistended, soft and nontender. No organomegaly or masses felt. Normal bowel sounds heard. Central nervous system: Alert and oriented. No focal neurological deficits. Extremities: No edema, no clubbing ,no cyanosis Skin: No rashes, lesions or ulcers,no icterus ,no pallor .  Data Reviewed: I have personally reviewed following labs and imaging studies  CBC: Recent Labs  Lab 04/26/19 1333 04/26/19 1545 04/27/19 0209 04/28/19 0645 04/29/19 0537  WBC 4.4  --  2.6* 2.3* 2.6*  NEUTROABS  --  2.7  --   --   --   HGB 6.7*  --  7.2* 7.1* 7.2*  HCT 24.7*  --  25.1* 25.2* 25.2*  MCV 70.8*  --   71.9* 73.5* 72.8*  PLT 218  --  148* 130* 947*   Basic Metabolic Panel: Recent Labs  Lab 04/26/19 1333 04/26/19 1545 04/27/19 0209 04/27/19 1820 04/28/19 0645 04/29/19 0537  NA 138  --  138 139 137 137  K 2.5*  --  2.7* 3.3* 3.1* 3.2*  CL 99  --  103 107 105 107  CO2 24  --  25 24 23 22   GLUCOSE 113*  --  125* 110* 106* 98  BUN <5*  --  <5* <5* <5* <5*  CREATININE 0.61  --  0.56 0.56 0.54 0.47  CALCIUM 7.8*  --  7.4* 7.5* 7.5* 8.2*  MG  --  1.1*  --  1.9  --   --    GFR: Estimated Creatinine Clearance: 99 mL/min (by C-G formula based on SCr of 0.47 mg/dL). Liver Function Tests: Recent Labs  Lab 04/26/19 1545 04/27/19 0209 04/28/19 0645 04/29/19 0537  AST 138* 117* 88* 91*  ALT 46* 40 32 33  ALKPHOS 72 72 60 60  BILITOT 1.2 1.1 1.0 0.8  PROT 7.0 6.5 6.2* 6.7  ALBUMIN 3.6 3.3* 3.0* 3.3*   Recent Labs  Lab 04/26/19 1545 04/28/19 0645  LIPASE 157* 110*  AMYLASE  --  60   No results for input(s): AMMONIA in the last 168 hours. Coagulation Profile: No results for input(s): INR, PROTIME in the last 168 hours. Cardiac Enzymes: No results for input(s): CKTOTAL, CKMB, CKMBINDEX, TROPONINI in the last 168 hours. BNP (last 3 results) No results for input(s): PROBNP in the last 8760 hours. HbA1C: No results for input(s): HGBA1C in the last 72 hours. CBG: Recent Labs  Lab 04/26/19 1508  GLUCAP 114*   Lipid Profile: Recent Labs    04/28/19 0645  TRIG 75   Thyroid Function Tests: No results for input(s): TSH, T4TOTAL, FREET4, T3FREE, THYROIDAB in the last 72 hours. Anemia Panel: Recent Labs    04/26/19 1545  VITAMINB12 130*  FOLATE 5.5*  FERRITIN 23  TIBC 421  IRON 196*  RETICCTPCT 0.9   Sepsis Labs: No results for input(s): PROCALCITON, LATICACIDVEN in the last 168 hours.  Recent Results (from the past 240 hour(s))  SARS CORONAVIRUS 2 (TAT 6-24 HRS) Nasopharyngeal Nasopharyngeal Swab     Status: None   Collection Time: 04/26/19  7:27 PM    Specimen: Nasopharyngeal Swab  Result Value Ref Range Status   SARS Coronavirus 2 NEGATIVE NEGATIVE Final    Comment: (NOTE) SARS-CoV-2 target nucleic acids are NOT DETECTED. The SARS-CoV-2 RNA is generally detectable in upper and lower respiratory specimens during the acute phase of infection. Negative results do not preclude SARS-CoV-2 infection, do not rule out co-infections with other pathogens, and should not be used as the sole basis for treatment or other patient management decisions. Negative results must be combined with clinical observations, patient history, and epidemiological information. The expected result is Negative. Fact Sheet for Patients: SugarRoll.be Fact Sheet for Healthcare Providers: https://www.woods-mathews.com/ This test is not yet approved or cleared by the [place] FDA and  has been authorized for detection and/or diagnosis of SARS-CoV-2 by FDA under an Emergency Use Authorization (EUA). This EUA will remain  in effect (meaning this test can be used) for the duration of the COVID-19 declaration under Section 56 4(b)(1) of the Act, 21 U.S.C. section 360bbb-3(b)(1), unless the authorization is terminated or revoked sooner. Performed at [facility], [address], [place], [place] [address]          Radiology Studies: US Abdomen Limited RUQ  Result Date: 04/28/2019 CLINICAL DATA:  Increased LFTs right upper quadrant pain EXAM: ULTRASOUND ABDOMEN LIMITED RIGHT UPPER QUADRANT COMPARISON:  CT same day FINDINGS: Gallbladder: Layering sludge is seen within the gallbladder. The gallbladder wall measures from 2-3.8 mm. No pericholecystic fluid. No sonographic Murphy sign. Common bile duct: Diameter: 3.8 mm Liver: Increased echotexture seen throughout. No focal abnormality or biliary ductal dilatation. Portal vein is patent on color Doppler imaging with normal direction of blood flow towards the liver. Other:  None. IMPRESSION: Layering gallbladder sludge with mild prominence of the gallbladder wall. No definite evidence of acute cholecystitis. Hepatic steatosis. Electronically Signed   By: Prudencio Pair M.D.   On: 04/28/2019 19:12        Scheduled Meds: . amLODipine  5 mg Oral Daily  . folic acid  1 mg Oral Daily  . hydrochlorothiazide  12.5 mg Oral Daily  . peg 3350 powder  0.5 kit Oral Once  . vitamin B-12  500 mcg Oral Daily   Continuous Infusions: . sodium chloride Stopped (04/29/19 0840)  . potassium chloride 10 mEq (04/29/19 0756)     LOS: 3 days    Time spent: 35 mins.More than 50% of that time was spent in counseling and/or coordination of care.      Shelly Coss, MD [facility] P2/10/2019, 8:48 AM

## 2019-04-29 NOTE — Transfer of Care (Signed)
Immediate Anesthesia Transfer of Care Note  Patient: Marsi Turvey  Procedure(s) Performed: ESOPHAGOGASTRODUODENOSCOPY (EGD) WITH PROPOFOL (N/A ) COLONOSCOPY WITH PROPOFOL (N/A ) HOT HEMOSTASIS (ARGON PLASMA COAGULATION/BICAP) (N/A )  Patient Location: Endoscopy Unit  Anesthesia Type:MAC  Level of Consciousness: drowsy and patient cooperative  Airway & Oxygen Therapy: Patient Spontanous Breathing and Patient connected to nasal cannula oxygen  Post-op Assessment: Report given to RN and Post -op Vital signs reviewed and stable  Post vital signs: Reviewed and stable  Last Vitals:  Vitals Value Taken Time  BP 153/102 04/29/19 1232  Temp 36.4 C 04/29/19 1232  Pulse 117 04/29/19 1232  Resp 26 04/29/19 1232  SpO2 100 % 04/29/19 1232  Vitals shown include unvalidated device data.  Last Pain:  Vitals:   04/29/19 1232  TempSrc: Temporal  PainSc: 0-No pain      Patients Stated Pain Goal: 0 (84/53/64 [id number])  Complications: No apparent anesthesia complications

## 2019-04-29 NOTE — Anesthesia Procedure Notes (Signed)
Procedure Name: MAC Date/Time: 04/29/2019 11:45 AM Performed by: Amadeo Garnet, CRNA Pre-anesthesia Checklist: Patient identified, Emergency Drugs available, Suction available and Patient being monitored Patient Re-evaluated:Patient Re-evaluated prior to induction Oxygen Delivery Method: Nasal cannula Preoxygenation: Pre-oxygenation with 100% oxygen Induction Type: IV induction Placement Confirmation: positive ETCO2 Dental Injury: Teeth and Oropharynx as per pre-operative assessment

## 2019-04-29 NOTE — Interval H&P Note (Signed)
History and Physical Interval Note:  04/29/2019 11:00 AM  Laura Hunter  has presented today for surgery, with the diagnosis of Anemia, epigastric pain, rectal bleeding.  The various methods of treatment have been discussed with the patient and family. After consideration of risks, benefits and other options for treatment, the patient has consented to  Procedure(s): ESOPHAGOGASTRODUODENOSCOPY (EGD) WITH PROPOFOL (N/A) COLONOSCOPY WITH PROPOFOL (N/A) as a surgical intervention.  The patient's history has been reviewed, patient examined, no change in status, stable for surgery.  I have reviewed the patient's chart and labs.  Questions were answered to the patient's satisfaction.     Venita Lick. Russella Dar

## 2019-04-29 NOTE — Interval H&P Note (Signed)
History and Physical Interval Note:  04/29/2019 11:07 AM  Laura Hunter  has presented today for surgery, with the diagnosis of Anemia, epigastric pain, rectal bleeding.  The various methods of treatment have been discussed with the patient and family. After consideration of risks, benefits and other options for treatment, the patient has consented to  Procedure(s): ESOPHAGOGASTRODUODENOSCOPY (EGD) WITH PROPOFOL (N/A) COLONOSCOPY WITH PROPOFOL (N/A) as a surgical intervention.  The patient's history has been reviewed, patient examined, no change in status, stable for surgery.  I have reviewed the patient's chart and labs.  Questions were answered to the patient's satisfaction.     Venita Lick. Russella Dar

## 2019-04-29 NOTE — Plan of Care (Signed)
Plan of care was reviewed with pt at bedside. IV saline locked for procedure. Pt NPO except meds. Up adlib. Will continue to monitor pt. Pt leaving floor with transport for procedure.  Problem: Education: Goal: Knowledge of Pancreatitis treatment and prevention will improve 04/29/2019 1015 by Marty Heck, RN Outcome: Progressing 04/29/2019 1015 by Marty Heck, RN Outcome: Progressing   Problem: Health Behavior/Discharge Planning: Goal: Ability to formulate a plan to maintain an alcohol-free life will improve 04/29/2019 1015 by Marty Heck, RN Outcome: Progressing 04/29/2019 1015 by Marty Heck, RN Outcome: Progressing   Problem: Nutritional: Goal: Ability to achieve adequate nutritional intake will improve 04/29/2019 1015 by Marty Heck, RN Outcome: Progressing 04/29/2019 1015 by Marty Heck, RN Outcome: Progressing   Problem: Clinical Measurements: Goal: Complications related to the disease process, condition or treatment will be avoided or minimized 04/29/2019 1015 by Marty Heck, RN Outcome: Progressing 04/29/2019 1015 by Marty Heck, RN Outcome: Progressing   Problem: Education: Goal: Knowledge of General Education information will improve Description: Including pain rating scale, medication(s)/side effects and non-pharmacologic comfort measures 04/29/2019 1015 by Marty Heck, RN Outcome: Progressing 04/29/2019 1015 by Marty Heck, RN Outcome: Progressing   Problem: Health Behavior/Discharge Planning: Goal: Ability to manage health-related needs will improve 04/29/2019 1015 by Marty Heck, RN Outcome: Progressing 04/29/2019 1015 by Marty Heck, RN Outcome: Progressing   Problem: Clinical Measurements: Goal: Ability to maintain clinical measurements within normal limits will improve 04/29/2019 1015 by Marty Heck, RN Outcome: Progressing 04/29/2019 1015 by Marty Heck, RN Outcome: Progressing Goal: Will remain free from  infection 04/29/2019 1015 by Marty Heck, RN Outcome: Progressing 04/29/2019 1015 by Marty Heck, RN Outcome: Progressing Goal: Diagnostic test results will improve 04/29/2019 1015 by Marty Heck, RN Outcome: Progressing 04/29/2019 1015 by Marty Heck, RN Outcome: Progressing Goal: Respiratory complications will improve 04/29/2019 1015 by Marty Heck, RN Outcome: Progressing 04/29/2019 1015 by Marty Heck, RN Outcome: Progressing Goal: Cardiovascular complication will be avoided 04/29/2019 1015 by Marty Heck, RN Outcome: Progressing 04/29/2019 1015 by Marty Heck, RN Outcome: Progressing   Problem: Activity: Goal: Risk for activity intolerance will decrease 04/29/2019 1015 by Marty Heck, RN Outcome: Progressing 04/29/2019 1015 by Marty Heck, RN Outcome: Progressing   Problem: Nutrition: Goal: Adequate nutrition will be maintained 04/29/2019 1015 by Marty Heck, RN Outcome: Progressing 04/29/2019 1015 by Marty Heck, RN Outcome: Progressing   Problem: Coping: Goal: Level of anxiety will decrease 04/29/2019 1015 by Marty Heck, RN Outcome: Progressing 04/29/2019 1015 by Marty Heck, RN Outcome: Progressing   Problem: Elimination: Goal: Will not experience complications related to bowel motility 04/29/2019 1015 by Marty Heck, RN Outcome: Progressing 04/29/2019 1015 by Marty Heck, RN Outcome: Progressing Goal: Will not experience complications related to urinary retention 04/29/2019 1015 by Marty Heck, RN Outcome: Progressing 04/29/2019 1015 by Marty Heck, RN Outcome: Progressing   Problem: Pain Managment: Goal: General experience of comfort will improve 04/29/2019 1015 by Marty Heck, RN Outcome: Progressing 04/29/2019 1015 by Marty Heck, RN Outcome: Progressing   Problem: Safety: Goal: Ability to remain free from injury will improve 04/29/2019 1015 by Marty Heck, RN Outcome: Progressing 04/29/2019 1015 by  Marty Heck, RN Outcome: Progressing   Problem: Skin Integrity: Goal: Risk for impaired skin integrity will decrease 04/29/2019 1015 by Marty Heck, RN Outcome: Progressing 04/29/2019 1015  by Juliane Poot, RN Outcome: Progressing

## 2019-04-29 NOTE — Anesthesia Postprocedure Evaluation (Signed)
Anesthesia Post Note  Patient: Laura Hunter  Procedure(s) Performed: ESOPHAGOGASTRODUODENOSCOPY (EGD) WITH PROPOFOL (N/A ) COLONOSCOPY WITH PROPOFOL (N/A ) HOT HEMOSTASIS (ARGON PLASMA COAGULATION/BICAP) (N/A )     Patient location during evaluation: PACU Anesthesia Type: MAC Level of consciousness: awake and alert Pain management: pain level controlled Vital Signs Assessment: post-procedure vital signs reviewed and stable Respiratory status: spontaneous breathing, nonlabored ventilation, respiratory function stable and patient connected to nasal cannula oxygen Cardiovascular status: stable and blood pressure returned to baseline Postop Assessment: no apparent nausea or vomiting Anesthetic complications: no    Last Vitals:  Vitals:   04/29/19 1242 04/29/19 1250  BP: (!) 141/100 (!) 138/91  Pulse: (!) 104   Resp: 19 18  Temp:    SpO2: 99%     Last Pain:  Vitals:   04/29/19 1250  TempSrc:   PainSc: 0-No pain                 Gildardo Tickner S

## 2019-04-29 NOTE — Op Note (Signed)
[facility] Patient Name: Laura Hunter Procedure Date : 04/29/2019 MRN: [id number] Attending MD: Ladene Artist , MD Date of Birth: 11-Sep-1980 CSN: [id number] Age: 39 Admit Type: Inpatient Procedure:                Colonoscopy Indications:              Hematochezia, Iron deficiency anemia Providers:                Pricilla Riffle. Fuller Plan, MD, Jeanella Cara, RN,                            Lazaro Arms, Technician, Corie Chiquito, Technician Referring MD:             [facility] Medicines:                Monitored Anesthesia Care Complications:            No immediate complications. Estimated blood loss:                            None. Estimated Blood Loss:     Estimated blood loss: none. Procedure:                Pre-Anesthesia Assessment:                           - Prior to the procedure, a History and Physical                            was performed, and patient medications and                            allergies were reviewed. The patient's tolerance of                            previous anesthesia was also reviewed. The risks                            and benefits of the procedure and the sedation                            options and risks were discussed with the patient.                            All questions were answered, and informed consent                            was obtained. Prior Anticoagulants: The patient has                            taken no previous anticoagulant or antiplatelet                            agents. ASA Grade Assessment: II - A patient with  mild systemic disease. After reviewing the risks                            and benefits, the patient was deemed in                            satisfactory condition to undergo the procedure.                           After obtaining informed consent, the colonoscope                            was passed under direct vision. Throughout the                procedure, the patient's blood pressure, pulse, and                            oxygen saturations were monitored continuously. The                            [id number]-[id number] ([id number] Olympus pediatric colonscope                            was introduced through the anus and advanced to the                            the cecum, identified by appendiceal orifice and                            ileocecal valve. The ileocecal valve, appendiceal                            orifice, and rectum were photographed. The quality                            of the bowel preparation was adequate after                            extensive lavage and suctioning. The colonoscopy                            was performed without difficulty. The patient                            tolerated the procedure well. Scope In: 11:49:53 AM Scope Out: 12:09:29 PM Scope Withdrawal Time: 0 hours 16 minutes 8 seconds  Total Procedure Duration: 0 hours 19 minutes 36 seconds  Findings:      The perianal and digital rectal examinations were normal.      Internal hemorrhoids were found during retroflexion. The hemorrhoids       were moderate and Grade I (internal hemorrhoids that do not prolapse).      The exam was otherwise without abnormality on direct and retroflexion       views. Impression:               -  Internal hemorrhoids.                           - The examination was otherwise normal on direct                            and retroflexion views.                           - No specimens collected. Recommendation:           - Repeat colonoscopy in 10 years for screening                            purposes with a more extensive bowel prep with Dr.                            Orvan Falconer.                           - Patient has a contact number available for                            emergencies. The signs and symptoms of potential                            delayed complications were discussed with the                             patient. Return to normal activities tomorrow.                            Written discharge instructions were provided to the                            patient.                           - Resume previous diet.                           - Continue present medications.                           - Hematochezia secondary to hemorrhoids. No finding                            to explain anemia.                           - Consider hemorrhoidal banding as outpatient if                            bleeding is a persistent problem. Procedure Code(s):        --- Professional ---  75301, Colonoscopy, flexible; diagnostic, including                            collection of specimen(s) by brushing or washing,                            when performed (separate procedure) Diagnosis Code(s):        --- Professional ---                           K64.0, First degree hemorrhoids                           K92.1, Melena (includes Hematochezia)                           D50.9, Iron deficiency anemia, unspecified CPT copyright 2019 [employer]. All rights reserved. The codes documented in this report are preliminary and upon coder review may  be revised to meet current compliance requirements. Meryl Dare, MD 04/29/2019 12:30:43 PM This report has been signed electronically. Number of Addenda: 0

## 2019-04-29 NOTE — Anesthesia Preprocedure Evaluation (Signed)
Anesthesia Evaluation  Patient identified by MRN, date of birth, ID band Patient awake    Reviewed: Allergy & Precautions, NPO status , Patient's Chart, lab work & pertinent test results  Airway Mallampati: II  TM Distance: >3 FB Neck ROM: Full    Dental no notable dental hx.    Pulmonary neg pulmonary ROS, former smoker,    Pulmonary exam normal breath sounds clear to auscultation       Cardiovascular hypertension, Normal cardiovascular exam Rhythm:Regular Rate:Normal     Neuro/Psych negative neurological ROS  negative psych ROS   GI/Hepatic Neg liver ROS, Mild pancreatitis   Endo/Other  obesity  Renal/GU negative Renal ROS  negative genitourinary   Musculoskeletal negative musculoskeletal ROS (+)   Abdominal   Peds negative pediatric ROS (+)  Hematology  (+) anemia ,   Anesthesia Other Findings   Reproductive/Obstetrics negative OB ROS                             Anesthesia Physical Anesthesia Plan  ASA: III  Anesthesia Plan: MAC   Post-op Pain Management:    Induction: Intravenous  PONV Risk Score and Plan: 0  Airway Management Planned: Simple Face Mask  Additional Equipment:   Intra-op Plan:   Post-operative Plan:   Informed Consent: I have reviewed the patients History and Physical, chart, labs and discussed the procedure including the risks, benefits and alternatives for the proposed anesthesia with the patient or authorized representative who has indicated his/her understanding and acceptance.     Dental advisory given  Plan Discussed with: CRNA and Surgeon  Anesthesia Plan Comments:         Anesthesia Quick Evaluation

## 2019-04-30 DIAGNOSIS — K852 Alcohol induced acute pancreatitis without necrosis or infection: Principal | ICD-10-CM

## 2019-04-30 LAB — CBC WITH DIFFERENTIAL/PLATELET
Abs Immature Granulocytes: 0.02 10*3/uL (ref 0.00–0.07)
Basophils Absolute: 0 10*3/uL (ref 0.0–0.1)
Basophils Relative: 1 %
Eosinophils Absolute: 0 10*3/uL (ref 0.0–0.5)
Eosinophils Relative: 2 %
HCT: 29.7 % — ABNORMAL LOW (ref 36.0–46.0)
Hemoglobin: 8.2 g/dL — ABNORMAL LOW (ref 12.0–15.0)
Immature Granulocytes: 1 %
Lymphocytes Relative: 44 %
Lymphs Abs: 0.8 10*3/uL (ref 0.7–4.0)
MCH: 20.4 pg — ABNORMAL LOW (ref 26.0–34.0)
MCHC: 27.6 g/dL — ABNORMAL LOW (ref 30.0–36.0)
MCV: 74.1 fL — ABNORMAL LOW (ref 80.0–100.0)
Monocytes Absolute: 0.1 10*3/uL (ref 0.1–1.0)
Monocytes Relative: 8 %
Neutro Abs: 0.8 10*3/uL — ABNORMAL LOW (ref 1.7–7.7)
Neutrophils Relative %: 44 %
Platelets: 85 10*3/uL — ABNORMAL LOW (ref 150–400)
RBC: 4.01 MIL/uL (ref 3.87–5.11)
RDW: 31.5 % — ABNORMAL HIGH (ref 11.5–15.5)
WBC: 1.7 10*3/uL — ABNORMAL LOW (ref 4.0–10.5)
nRBC: 20.5 % — ABNORMAL HIGH (ref 0.0–0.2)

## 2019-04-30 LAB — TYPE AND SCREEN
ABO/RH(D): AB POS
Antibody Screen: NEGATIVE
Unit division: 0
Unit division: 0
Unit division: 0

## 2019-04-30 LAB — BPAM RBC
Blood Product Expiration Date: 202103092359
Blood Product Expiration Date: 202103092359
Blood Product Expiration Date: 202103112359
ISSUE DATE / TIME: 202102051754
Unit Type and Rh: 7300
Unit Type and Rh: 8400
Unit Type and Rh: 8400

## 2019-04-30 LAB — BASIC METABOLIC PANEL
Anion gap: 9 (ref 5–15)
BUN: <5 mg/dL — ABNORMAL LOW (ref 6–20)
CO2: 26 mmol/L (ref 22–32)
Calcium: 8.8 mg/dL — ABNORMAL LOW (ref 8.9–10.3)
Chloride: 101 mmol/L (ref 98–111)
Creatinine, Ser: 0.58 mg/dL (ref 0.44–1.00)
GFR calc Af Amer: >60 mL/min (ref >60)
GFR calc non Af Amer: >60 mL/min (ref >60)
Glucose, Bld: 90 mg/dL (ref 70–99)
Potassium: 3.1 mmol/L — ABNORMAL LOW (ref 3.5–5.1)
Sodium: 136 mmol/L (ref 135–145)

## 2019-04-30 LAB — HCV INTERPRETATION

## 2019-04-30 LAB — PATHOLOGIST SMEAR REVIEW

## 2019-04-30 LAB — IGG: IgG (Immunoglobin G), Serum: 1542 mg/dL (ref 586–1602)

## 2019-04-30 LAB — HCV AB W REFLEX TO QUANT PCR
HCV Ab: 0.1 s/co ratio (ref 0.0–0.9)
HCV Ab: <0.1 s/co ratio (ref 0.0–0.9)

## 2019-04-30 LAB — CERULOPLASMIN: Ceruloplasmin: 25.6 mg/dL (ref 19.0–39.0)

## 2019-04-30 LAB — CALCIUM, IONIZED: Calcium, Ionized, Serum: 4.6 mg/dL (ref 4.5–5.6)

## 2019-04-30 LAB — ALPHA-1-ANTITRYPSIN: A-1 Antitrypsin, Ser: 129 mg/dL (ref 100–188)

## 2019-04-30 LAB — HEPATITIS B SURFACE ANTIBODY, QUANTITATIVE
Hep B S AB Quant (Post): <3.1 m[IU]/mL — ABNORMAL LOW (ref >9.9)
Hep B S AB Quant (Post): <3.1 m[IU]/mL — ABNORMAL LOW (ref >9.9)

## 2019-04-30 LAB — ANA: Anti Nuclear Antibody (ANA): NEGATIVE

## 2019-04-30 MED ORDER — FERROUS SULFATE 325 (65 FE) MG PO TABS: 325.0000 mg | ORAL_TABLET | Freq: Every day | ORAL | 3 refills | Status: DC

## 2019-04-30 MED ORDER — FOLIC ACID 1 MG PO TABS: 1.0000 mg | ORAL_TABLET | Freq: Every day | ORAL | 1 refills | Status: DC

## 2019-04-30 MED ORDER — FAMOTIDINE 40 MG PO TABS: 40.0000 mg | ORAL_TABLET | Freq: Every day | ORAL | 1 refills | Status: DC

## 2019-04-30 MED ORDER — FERROUS SULFATE 325 (65 FE) MG PO TABS
325.0000 mg | ORAL_TABLET | Freq: Every day | ORAL | Status: DC
  Administered 2019-04-30: 325 mg via ORAL
  Filled 2019-04-30: qty 1

## 2019-04-30 MED ORDER — POTASSIUM CHLORIDE CRYS ER 20 MEQ PO TBCR
40.0000 meq | EXTENDED_RELEASE_TABLET | Freq: Once | ORAL | Status: AC
  Administered 2019-04-30: 40 meq via ORAL
  Filled 2019-04-30: qty 2

## 2019-04-30 MED ORDER — CYANOCOBALAMIN 500 MCG PO TABS: 500.0000 ug | ORAL_TABLET | Freq: Every day | ORAL | 1 refills | Status: DC

## 2019-04-30 NOTE — Plan of Care (Signed)
  Problem: Nutritional: Goal: Ability to achieve adequate nutritional intake will improve Outcome: Progressing   

## 2019-04-30 NOTE — Plan of Care (Signed)
  Problem: Education: Goal: Knowledge of Pancreatitis treatment and prevention will improve Outcome: Adequate for Discharge   Problem: Health Behavior/Discharge Planning: Goal: Ability to formulate a plan to maintain an alcohol-free life will improve Outcome: Adequate for Discharge   Problem: Nutritional: Goal: Ability to achieve adequate nutritional intake will improve 04/30/2019 1454 by Gladstone Pih, RN Outcome: Adequate for Discharge 04/30/2019 0906 by Gladstone Pih, RN Outcome: Progressing   Problem: Clinical Measurements: Goal: Complications related to the disease process, condition or treatment will be avoided or minimized Outcome: Adequate for Discharge   Problem: Education: Goal: Knowledge of General Education information will improve Description: Including pain rating scale, medication(s)/side effects and non-pharmacologic comfort measures Outcome: Adequate for Discharge   Problem: Health Behavior/Discharge Planning: Goal: Ability to manage health-related needs will improve Outcome: Adequate for Discharge   Problem: Clinical Measurements: Goal: Ability to maintain clinical measurements within normal limits will improve Outcome: Adequate for Discharge Goal: Will remain free from infection Outcome: Adequate for Discharge Goal: Diagnostic test results will improve Outcome: Adequate for Discharge Goal: Respiratory complications will improve Outcome: Adequate for Discharge Goal: Cardiovascular complication will be avoided Outcome: Adequate for Discharge   Problem: Activity: Goal: Risk for activity intolerance will decrease Outcome: Adequate for Discharge   Problem: Nutrition: Goal: Adequate nutrition will be maintained Outcome: Adequate for Discharge   Problem: Coping: Goal: Level of anxiety will decrease Outcome: Adequate for Discharge   Problem: Elimination: Goal: Will not experience complications related to bowel motility Outcome: Adequate for  Discharge Goal: Will not experience complications related to urinary retention Outcome: Adequate for Discharge   Problem: Pain Managment: Goal: General experience of comfort will improve Outcome: Adequate for Discharge   Problem: Safety: Goal: Ability to remain free from injury will improve Outcome: Adequate for Discharge   Problem: Skin Integrity: Goal: Risk for impaired skin integrity will decrease Outcome: Adequate for Discharge

## 2019-04-30 NOTE — Progress Notes (Signed)
Laura Hunter to be discharged home per MD order. Discussed prescriptions and follow up appointments with the patient. Prescriptions given to patient; medication list explained in detail. Patient verbalized understanding.  Skin clean, dry and intact without evidence of skin break down, no evidence of skin tears noted. IV catheter discontinued intact. Site without signs and symptoms of complications. Dressing and pressure applied. Pt denies pain at the site currently. No complaints noted.  Patient free of lines, drains, and wounds.   An After Visit Summary (AVS) was printed and given to the patient. Patient escorted via wheelchair, and discharged home via private auto.  Gladstone Pih, RN

## 2019-04-30 NOTE — Progress Notes (Addendum)
Progress Note    ASSESSMENT AND PLAN:   30. 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 CTAP remarkable for acute pancreatitis.   -Pancreatitis secondary to Etoh? Only gb sludge on ultrasound. Normal triglycerides, normal Ca+. She is getting soft diet for lunch and then possibly for discharge.  -Regarding liver tests, AST / ALT ratio suggests Etoh. Awaiting additional labs to evaluate for other etiologies.  -low fat diet upon discharge -Follow up with Carl Best, NP at [facility] on 2/22 at 10am  2. Microcytic anemia. She underwent EGD and colonoscopy. One focal friable area of oozing at [place] anastomosis treated with APC. Colonoscopy unremarkable. Anemia most likely nutritional though with both B12 and folate deficiencies  though interestingly she is microcytic. Ferritin 23.   -continue B12, folate supplements -Hgb stable at 8.2 post unit of blood on 2/5.     SUBJECTIVE   Minimal nausea. Tolerated coffee this am. Still having loose stools post bowel prep for colonoscopy   OBJECTIVE:     Vital signs in last 24 hours: Temp:  [97.5 F (36.4 C)-99.5 F (37.5 C)] 98.7 F (37.1 C) (02/09 0908) Pulse Rate:  [75-118] 75 (02/09 0908) Resp:  [10-23] 18 (02/09 0908) BP: (138-171)/(85-102) 150/96 (02/09 0908) SpO2:  [97 %-100 %] 97 % (02/09 0908) Last BM Date: 04/30/19 General:   Alert female in NAD EENT:  Normal hearing, non icteric sclera   Heart:  Regular rate and rhythm;  No lower extremity edema   Pulm: Normal respiratory effort   Abdomen:  Soft, nondistended, nontender.  Normal bowel sounds.          Neurologic:  Alert and  oriented x4;  grossly normal neurologically. Psych:  Pleasant, cooperative.  Normal mood and affect.   Intake/Output from previous day: 02/08 0701 - 02/09 0700 In: 1522.5 [P.O.:690; I.V.:832.5] Out: 1 [Urine:1] Intake/Output this shift: No intake/output data recorded.  Lab  Results: Recent Labs    04/28/19 0645 04/29/19 0537 04/30/19 0635  WBC 2.3* 2.6* 1.7*  HGB 7.1* 7.2* 8.2*  HCT 25.2* 25.2* 29.7*  PLT 130* 112* 85*   BMET Recent Labs    04/28/19 0645 04/29/19 0537 04/30/19 0635  NA 137 137 136  K 3.1* 3.2* 3.1*  CL 105 107 101  CO2 23 22 26   GLUCOSE 106* 98 90  BUN <5* <5* <5*  CREATININE 0.54 0.47 0.58  CALCIUM 7.5* 8.2* 8.8*   LFT Recent Labs    04/29/19 0537  PROT 6.7  ALBUMIN 3.3*  AST 91*  ALT 33  ALKPHOS 60  BILITOT 0.8   PT/INR No results for input(s): LABPROT, INR in the last 72 hours. Hepatitis Panel Recent Labs    04/28/19 0645  HEPBSAG NON REACTIVE    US Abdomen Limited RUQ  Result Date: 04/28/2019 CLINICAL DATA:  Increased LFTs right upper quadrant pain EXAM: ULTRASOUND ABDOMEN LIMITED RIGHT UPPER QUADRANT COMPARISON:  CT same day FINDINGS: Gallbladder: Layering sludge is seen within the gallbladder. The gallbladder wall measures from 2-3.8 mm. No pericholecystic fluid. No sonographic Murphy sign. Common bile duct: Diameter: 3.8 mm Liver: Increased echotexture seen throughout. No focal abnormality or biliary ductal dilatation. Portal vein is patent on color Doppler imaging with normal direction of blood flow towards the liver. Other: None. IMPRESSION: Layering gallbladder sludge with mild prominence of the gallbladder wall. No definite evidence of acute cholecystitis. Hepatic steatosis. Electronically Signed   By: Ebony Cargo.D.  On: 04/28/2019 19:12    Principal Problem:   Acute alcoholic pancreatitis Active Problems:   Anemia due to GI blood loss   Malpositioned IUD   Thrombosis of ovarian vein   Hypokalemia   Iron deficiency anemia   Hematochezia     LOS: 4 days   Willette Cluster ,NP 04/30/2019, 9:54 AM  I have discussed the case with the PA, and that is the plan I formulated. I personally interviewed and examined the patient.  Pancreatitis improved.  Anemia stable and endoscopic workup done.   Iron/B12/folic deficient - to begin replacement.  Agree with discharge today and will arrange clinic follow up with Dr. Orvan Falconer.    Charlie Pitter III Office: [phone]

## 2019-04-30 NOTE — Plan of Care (Signed)
  Problem: Education: Goal: Knowledge of Pancreatitis treatment and prevention will improve Outcome: Progressing   Problem: Health Behavior/Discharge Planning: Goal: Ability to formulate a plan to maintain an alcohol-free life will improve Outcome: Progressing   Problem: Nutritional: Goal: Ability to achieve adequate nutritional intake will improve Outcome: Progressing   Problem: Clinical Measurements: Goal: Complications related to the disease process, condition or treatment will be avoided or minimized Outcome: Progressing   Problem: Education: Goal: Knowledge of General Education information will improve Description: Including pain rating scale, medication(s)/side effects and non-pharmacologic comfort measures Outcome: Progressing   Problem: Health Behavior/Discharge Planning: Goal: Ability to manage health-related needs will improve Outcome: Progressing   Problem: Clinical Measurements: Goal: Ability to maintain clinical measurements within normal limits will improve Outcome: Progressing   Problem: Clinical Measurements: Goal: Will remain free from infection Outcome: Progressing   Problem: Clinical Measurements: Goal: Diagnostic test results will improve Outcome: Progressing   Problem: Clinical Measurements: Goal: Respiratory complications will improve Outcome: Progressing   Problem: Clinical Measurements: Goal: Cardiovascular complication will be avoided Outcome: Progressing   Problem: Activity: Goal: Risk for activity intolerance will decrease Outcome: Progressing   Problem: Nutrition: Goal: Adequate nutrition will be maintained Outcome: Progressing   Problem: Coping: Goal: Level of anxiety will decrease Outcome: Progressing   Problem: Elimination: Goal: Will not experience complications related to bowel motility Outcome: Progressing   Problem: Elimination: Goal: Will not experience complications related to urinary retention Outcome: Progressing    Problem: Pain Managment: Goal: General experience of comfort will improve Outcome: Progressing   Problem: Safety: Goal: Ability to remain free from injury will improve Outcome: Progressing   Problem: Skin Integrity: Goal: Risk for impaired skin integrity will decrease Outcome: Progressing

## 2019-04-30 NOTE — Discharge Summary (Signed)
Physician Discharge Summary  Laura Hunter MAU:[id number] DOB: [birth year] DOA: 04/26/2019  PCP: Patient, No Pcp Per  Admit date: 04/26/2019 Discharge date: 04/30/2019  Admitted From: Home Disposition:  Home  Discharge Condition:Stable CODE STATUS:FULL Diet recommendation: Heart Healthy   Brief/Interim Summary:  Patient is a 39 year old female with history of chronic alcohol abuse, hypertension, obesity status post gastric bypass surgery who presented with 3-week history of progressive worsening abdominal pain, generalized weakness, nausea, vomiting.  When she presented her liver enzymes were elevated, elevated lipase.  Abdominal CT showed acute edematous pancreatitis.  Started on conservative management.  She also gave history of melanotic stools and her hemoglobin was low on presentation.  GI consulted.  Started on conservative management for her pancreatitis.  Abdomen pain has currently resolved and she is tolerating diet.  She underwent EGD/colonoscopy by GI.  Currently she is hemodynamically stable for discharge.  She will follow with GI as an outpatient.  Following problems were addressed during hospitalization:   Acute pancreatitis: Most likely associated with alcohol.  CT abdomen showed pancreatic edema, no gallstones.  Has history of chronic alcohol abuse.  Drinks hard liquor. Started on IV fluids, bowel rest, antiemetics. Now abdominal pain has improved.  Tolerating diet.  Transaminitis: Most likely acid with chronic alcohol abuse/hepatic steatosis.  AST more than ALT. Liver enzymes improving. Ultrasound of the right upper quadrant showed hepatic steatosis. Check liver function test during follow-up with GI.  Acute on chronic GI bleed: Hemoglobin of 6.7 on admission.  She was transfused with a unit of PRBC. Marland KitchenShe was also taking NSAIDs at home.    Underwent EGD/colonoscopy which showed oozing in the area of gastrojejunal anastomosis, internal hemorrhoids.  Started on  famotidine.  Pancytopenia: She has pancytopenia picture . Folic acid and vitamin B12 are low .  Iron studies showed normal iron.  This could be associated with decreased absorption due to gastric bypass.  Continue iron, vitamin B12 and folic acid supplementation.  Malpositioned IUD/partial thrombosis of ovarian vein: Denies any lower abdominal pain.  She needs to follow-up with GYN as an outpatient.    Discharge Diagnoses:  Principal Problem:   Acute alcoholic pancreatitis Active Problems:   Anemia due to GI blood loss   Malpositioned IUD   Thrombosis of ovarian vein   Hypokalemia   Iron deficiency anemia   Hematochezia    Discharge Instructions  Discharge Instructions    Diet - low sodium heart healthy   Complete by: As directed    Discharge instructions   Complete by: As directed    1)Please follow up with a PCP and gynecologist as an outpatient. 2)Take prescribed medications as instructed 3)Follow up with gastroenterology on 05/13/2019 at 10 am.  Do CBC and liver function test during the follow-up.   Increase activity slowly   Complete by: As directed      Allergies as of 04/30/2019      Reactions   Lisinopril Cough      Medication List    STOP taking these medications   naproxen sodium 220 MG tablet Commonly known as: ALEVE     TAKE these medications   amLODipine 5 MG tablet Commonly known as: NORVASC Take 5 mg by mouth daily.   cyclobenzaprine 5 MG tablet Commonly known as: FLEXERIL Take 5 mg by mouth 3 (three) times daily as needed for muscle spasms.   famotidine 40 MG tablet Commonly known as: PEPCID Take 1 tablet (40 mg total) by mouth daily. Start taking on: May 01, 2019  ferrous sulfate 325 (65 FE) MG tablet Take 1 tablet (325 mg total) by mouth daily with breakfast. Start taking on: April 30, 2977   folic acid 1 MG tablet Commonly known as: FOLVITE Take 1 tablet (1 mg total) by mouth daily. Start taking on: May 01, 2019    hydrochlorothiazide 12.5 MG tablet Commonly known as: HYDRODIURIL Take 12.5 mg by mouth daily.   vitamin B-12 500 MCG tablet Commonly known as: CYANOCOBALAMIN Take 1 tablet (500 mcg total) by mouth daily. Start taking on: May 01, 2019      Follow-up Information    Noralyn Pick, NP Follow up on 05/13/2019.   Specialty: Gastroenterology Why: at 10:00 am Contact information: [place] [address] [phone]          Allergies  Allergen Reactions  . Lisinopril Cough    Consultations:  GI   Procedures/Studies: CT ABDOMEN PELVIS W CONTRAST  Result Date: 04/26/2019 CLINICAL DATA:  Cholecystitis.  Dizziness and shortness of breath. EXAM: CT ABDOMEN AND PELVIS WITH CONTRAST TECHNIQUE: Multidetector CT imaging of the abdomen and pelvis was performed using the standard protocol following bolus administration of intravenous contrast. CONTRAST:  140mL OMNIPAQUE IOHEXOL 300 MG/ML  SOLN COMPARISON:  None. FINDINGS: Lower chest:  No contributory findings. Hepatobiliary: Hepatic steatosis.No evidence of biliary obstruction or stone. Pancreas: Fat stranding around the head and uncinate process tracking into the mesentery. There is correlative elevated lipase. No ductal dilatation or collection. Spleen: Unremarkable. Adrenals/Urinary Tract: Negative adrenals. No hydronephrosis or stone. Unremarkable bladder. Stomach/Bowel:  No obstruction. No appendicitis.  Gastric bypass. Vascular/Lymphatic: Mild atherosclerotic plaque. Venous findings noted below. No mass or adenopathy. Reproductive:Malpositioned IUD which is rotated with the side-arm through the lower uterine segment that approaches the serosa. The stem is at the level of the uterine and cervical junction. Nonobstructive filling defect the level of the mid right ovarian vein, see coronal reformats, with some regional fat stranding. The ovaries themselves appear symmetric. Other: No ascites or pneumoperitoneum.  Musculoskeletal: No acute abnormalities.  Spondylosis. IMPRESSION: 1. Mild acute edematous pancreatitis. 2. 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 3. Malpositioned IUD which is low and rotated with 1 of the side arms penetrating the lower uterine segment. 4. Hepatic steatosis. 5. Gastric bypass. Electronically Signed   By: Monte Fantasia M.D.   On: 04/26/2019 18:55   DG Abd Portable 1 View  Result Date: 04/26/2019 CLINICAL DATA:  Abdominal pain.  Emesis. EXAM: PORTABLE ABDOMEN - 1 VIEW COMPARISON:  None FINDINGS: Mid abdominal small bowel loop measures up to 3.4 cm. Right hemidiaphragm elevation. No free intraperitoneal air. Surgical sutures in the region of the stomach, consistent with Roux-en-Y gastric bypass. No abnormal abdominal calcifications. No appendicolith. Pelvic calcifications are most likely phleboliths. Intrauterine device. IMPRESSION: Nonspecific loop of borderline dilated mid abdominal small bowel. No specific evidence of bowel obstruction and no evidence of free intraperitoneal air. Electronically Signed   By: Abigail Miyamoto M.D.   On: 04/26/2019 16:29   US Abdomen Limited RUQ  Result Date: 04/28/2019 CLINICAL DATA:  Increased LFTs right upper quadrant pain EXAM: ULTRASOUND ABDOMEN LIMITED RIGHT UPPER QUADRANT COMPARISON:  CT same day FINDINGS: Gallbladder: Layering sludge is seen within the gallbladder. The gallbladder wall measures from 2-3.8 mm. No pericholecystic fluid. No sonographic Murphy sign. Common bile duct: Diameter: 3.8 mm Liver: Increased echotexture seen throughout. No focal abnormality or biliary ductal dilatation. Portal vein is patent on color Doppler imaging with normal direction of blood flow towards  the liver. Other: None. IMPRESSION: Layering gallbladder sludge with mild prominence of the gallbladder wall. No definite evidence of acute cholecystitis. Hepatic steatosis. Electronically Signed   By: Jonna Clark M.D.   On: 04/28/2019 19:12        Subjective: Patient seen and examined at the bedside this morning.  Hemodynamically stable for discharge home today.  Discharge Exam: Vitals:   04/30/19 0625 04/30/19 0908  BP: (!) 143/85 (!) 150/96  Pulse: 90 75  Resp: 18 18  Temp: 98.9 F (37.2 C) 98.7 F (37.1 C)  SpO2: 98% 97%   Vitals:   04/29/19 1719 04/29/19 2136 04/30/19 0625 04/30/19 0908  BP: (!) 151/98 (!) 141/101 (!) 143/85 (!) 150/96  Pulse: 86 86 90 75  Resp: 18 18 18 18   Temp: 99.5 F (37.5 C) 98.9 F (37.2 C) 98.9 F (37.2 C) 98.7 F (37.1 C)  TempSrc: Oral Oral Oral Oral  SpO2: 98% 98% 98% 97%  Weight:      Height:        General: Pt is alert, awake, not in acute distress Cardiovascular: RRR, S1/S2 +, no rubs, no gallops Respiratory: CTA bilaterally, no wheezing, no rhonchi Abdominal: Soft, NT, ND, bowel sounds + Extremities: no edema, no cyanosis    The results of significant diagnostics from this hospitalization (including imaging, microbiology, ancillary and laboratory) are listed below for reference.     Microbiology: Recent Results (from the past 240 hour(s))  SARS CORONAVIRUS 2 (TAT 6-24 HRS) Nasopharyngeal Nasopharyngeal Swab     Status: None   Collection Time: 04/26/19  7:27 PM   Specimen: Nasopharyngeal Swab  Result Value Ref Range Status   SARS Coronavirus 2 NEGATIVE NEGATIVE Final    Comment: (NOTE) SARS-CoV-2 target nucleic acids are NOT DETECTED. The SARS-CoV-2 RNA is generally detectable in upper and lower respiratory specimens during the acute phase of infection. Negative results do not preclude SARS-CoV-2 infection, do not rule out co-infections with other pathogens, and should not be used as the sole basis for treatment or other patient management decisions. Negative results must be combined with clinical observations, patient history, and epidemiological information. The expected result is Negative. Fact Sheet for  Patients: 06/24/19 Fact Sheet for Healthcare Providers: HairSlick.no This test is not yet approved or cleared by the [place] FDA and  has been authorized for detection and/or diagnosis of SARS-CoV-2 by FDA under an Emergency Use Authorization (EUA). This EUA will remain  in effect (meaning this test can be used) for the duration of the COVID-19 declaration under Section 56 4(b)(1) of the Act, 21 U.S.C. section 360bbb-3(b)(1), unless the authorization is terminated or revoked sooner. Performed at [facility], [address], [place], [place]      Labs: BNP (last 3 results) No results for input(s): BNP in the last 8760 hours. Basic Metabolic Panel: Recent Labs  Lab 04/26/19 1333 04/26/19 1545 04/27/19 0209 04/27/19 1820 04/28/19 0645 04/29/19 0537 04/30/19 0635  NA   < >  --  138 139 137 137 136  K   < >  --  2.7* 3.3* 3.1* 3.2* 3.1*  CL   < >  --  103 107 105 107 101  CO2   < >  --  25 24 23 22 26   GLUCOSE   < >  --  125* 110* 106* 98 90  BUN   < >  --  <5* <5* <5* <5* <5*  CREATININE   < >  --  0.56 0.56 0.54 0.47 0.58  CALCIUM   < >  --  7.4* 7.5* 7.5* 8.2* 8.8*  MG  --  1.1*  --  1.9  --   --   --    < > = values in this interval not displayed.   Liver Function Tests: Recent Labs  Lab 04/26/19 1545 04/27/19 0209 04/28/19 0645 04/29/19 0537  AST 138* 117* 88* 91*  ALT 46* 40 32 33  ALKPHOS 72 72 60 60  BILITOT 1.2 1.1 1.0 0.8  PROT 7.0 6.5 6.2* 6.7  ALBUMIN 3.6 3.3* 3.0* 3.3*   Recent Labs  Lab 04/26/19 1545 04/28/19 0645  LIPASE 157* 110*  AMYLASE  --  60   No results for input(s): AMMONIA in the last 168 hours. CBC: Recent Labs  Lab 04/26/19 1333 04/26/19 1545 04/27/19 0209 04/28/19 0645 04/29/19 0537 04/30/19 0635  WBC 4.4  --  2.6* 2.3* 2.6* 1.7*  NEUTROABS  --  2.7  --   --   --  0.8*  HGB 6.7*  --  7.2* 7.1* 7.2* 8.2*  HCT 24.7*  --  25.1* 25.2* 25.2*  29.7*  MCV 70.8*  --  71.9* 73.5* 72.8* 74.1*  PLT 218  --  148* 130* 112* 85*   Cardiac Enzymes: No results for input(s): CKTOTAL, CKMB, CKMBINDEX, TROPONINI in the last 168 hours. BNP: Invalid input(s): POCBNP CBG: Recent Labs  Lab 04/26/19 1508  GLUCAP 114*   D-Dimer No results for input(s): DDIMER in the last 72 hours. Hgb A1c No results for input(s): HGBA1C in the last 72 hours. Lipid Profile Recent Labs    04/28/19 0645  TRIG 75   Thyroid function studies No results for input(s): TSH, T4TOTAL, T3FREE, THYROIDAB in the last 72 hours.  Invalid input(s): FREET3 Anemia work up No results for input(s): VITAMINB12, FOLATE, FERRITIN, TIBC, IRON, RETICCTPCT in the last 72 hours. Urinalysis    Component Value Date/Time   COLORURINE YELLOW 04/26/2019 1510   APPEARANCEUR CLEAR 04/26/2019 1510   LABSPEC 1.011 04/26/2019 1510   PHURINE 6.0 04/26/2019 1510   GLUCOSEU NEGATIVE 04/26/2019 1510   HGBUR SMALL (A) 04/26/2019 1510   BILIRUBINUR NEGATIVE 04/26/2019 1510   KETONESUR NEGATIVE 04/26/2019 1510   PROTEINUR NEGATIVE 04/26/2019 1510   NITRITE NEGATIVE 04/26/2019 1510   LEUKOCYTESUR NEGATIVE 04/26/2019 1510   Sepsis Labs Invalid input(s): PROCALCITONIN,  WBC,  LACTICIDVEN Microbiology Recent Results (from the past 240 hour(s))  SARS CORONAVIRUS 2 (TAT 6-24 HRS) Nasopharyngeal Nasopharyngeal Swab     Status: None   Collection Time: 04/26/19  7:27 PM   Specimen: Nasopharyngeal Swab  Result Value Ref Range Status   SARS Coronavirus 2 NEGATIVE NEGATIVE Final    Comment: (NOTE) SARS-CoV-2 target nucleic acids are NOT DETECTED. The SARS-CoV-2 RNA is generally detectable in upper and lower respiratory specimens during the acute phase of infection. Negative results do not preclude SARS-CoV-2 infection, do not rule out co-infections with other pathogens, and should not be used as the sole basis for treatment or other patient management decisions. Negative results must be  combined with clinical observations, patient history, and epidemiological information. The expected result is Negative. Fact Sheet for Patients: HairSlick.no Fact Sheet for Healthcare Providers: quierodirigir.com This test is not yet approved or cleared by the [place] FDA and  has been authorized for detection and/or diagnosis of SARS-CoV-2 by FDA under an Emergency Use Authorization (EUA). This EUA will remain  in effect (meaning this test can be used) for the  duration of the COVID-19 declaration under Section 56 4(b)(1) of the Act, 21 U.S.C. section 360bbb-3(b)(1), unless the authorization is terminated or revoked sooner. Performed at [facility], [address], [place], [place] [address]     Please note: You were cared for by a hospitalist during your hospital stay. Once you are discharged, your primary care physician will handle any further medical issues. Please note that NO REFILLS for any discharge medications will be authorized once you are discharged, as it is imperative that you return to your primary care physician (or establish a relationship with a primary care physician if you do not have one) for your post hospital discharge needs so that they can reassess your need for medications and monitor your lab values.    Time coordinating discharge: 40 minutes  SIGNED:   Burnadette Pop, MD  [facility] 04/30/2019, 1:26 PM Pager [phone]  If 7PM-7AM, please contact night-coverage [email or web address] Password [email or web address]

## 2019-05-01 LAB — MITOCHONDRIAL ANTIBODIES: Mitochondrial M2 Ab, IgG: <20 Units (ref 0.0–20.0)

## 2019-05-01 LAB — ANTI-SMOOTH MUSCLE ANTIBODY, IGG: F-Actin IgG: 20 Units — ABNORMAL HIGH (ref 0–19)

## 2019-05-13 ENCOUNTER — Ambulatory Visit: Payer: BLUE CROSS/BLUE SHIELD | Admitting: Nurse Practitioner

## 2019-07-23 ENCOUNTER — Encounter (HOSPITAL_COMMUNITY): Payer: Self-pay | Admitting: *Deleted

## 2019-07-23 ENCOUNTER — Encounter: Payer: Self-pay | Admitting: Emergency Medicine

## 2019-07-23 ENCOUNTER — Other Ambulatory Visit: Payer: Self-pay

## 2019-07-23 ENCOUNTER — Ambulatory Visit (INDEPENDENT_AMBULATORY_CARE_PROVIDER_SITE_OTHER)
Admission: EM | Admit: 2019-07-23 | Discharge: 2019-07-23 | Disposition: A | Discharge status: Home / Self Care | Payer: BLUE CROSS/BLUE SHIELD | Source: Home / Self Care

## 2019-07-23 ENCOUNTER — Inpatient Hospital Stay (HOSPITAL_COMMUNITY)
Admission: EM | Admit: 2019-07-23 | Discharge: 2019-07-26 | DRG: 440 | Disposition: A | Discharge status: Home / Self Care | Payer: BLUE CROSS/BLUE SHIELD | Attending: Family Medicine | Admitting: Family Medicine

## 2019-07-23 ENCOUNTER — Inpatient Hospital Stay (HOSPITAL_COMMUNITY): Payer: BLUE CROSS/BLUE SHIELD

## 2019-07-23 DIAGNOSIS — D6959 Other secondary thrombocytopenia: Secondary | ICD-10-CM | POA: Diagnosis present

## 2019-07-23 DIAGNOSIS — K852 Alcohol induced acute pancreatitis without necrosis or infection: Secondary | ICD-10-CM | POA: Diagnosis present

## 2019-07-23 DIAGNOSIS — I10 Essential (primary) hypertension: Secondary | ICD-10-CM | POA: Diagnosis present

## 2019-07-23 DIAGNOSIS — R1010 Upper abdominal pain, unspecified: Secondary | ICD-10-CM

## 2019-07-23 DIAGNOSIS — Z79899 Other long term (current) drug therapy: Secondary | ICD-10-CM

## 2019-07-23 DIAGNOSIS — E876 Hypokalemia: Secondary | ICD-10-CM | POA: Diagnosis present

## 2019-07-23 DIAGNOSIS — Z20822 Contact with and (suspected) exposure to covid-19: Secondary | ICD-10-CM | POA: Diagnosis present

## 2019-07-23 DIAGNOSIS — Z8719 Personal history of other diseases of the digestive system: Secondary | ICD-10-CM | POA: Diagnosis not present

## 2019-07-23 DIAGNOSIS — K861 Other chronic pancreatitis: Secondary | ICD-10-CM | POA: Diagnosis present

## 2019-07-23 DIAGNOSIS — Z6834 Body mass index (BMI) 34.0-34.9, adult: Secondary | ICD-10-CM

## 2019-07-23 DIAGNOSIS — Z9884 Bariatric surgery status: Secondary | ICD-10-CM | POA: Diagnosis not present

## 2019-07-23 DIAGNOSIS — Z87891 Personal history of nicotine dependence: Secondary | ICD-10-CM | POA: Diagnosis not present

## 2019-07-23 DIAGNOSIS — K859 Acute pancreatitis without necrosis or infection, unspecified: Secondary | ICD-10-CM

## 2019-07-23 DIAGNOSIS — R112 Nausea with vomiting, unspecified: Secondary | ICD-10-CM

## 2019-07-23 HISTORY — DX: Acute pancreatitis without necrosis or infection, unspecified: K85.90

## 2019-07-23 LAB — CBC WITH DIFFERENTIAL/PLATELET
Abs Immature Granulocytes: 0.04 10*3/uL (ref 0.00–0.07)
Basophils Absolute: 0 10*3/uL (ref 0.0–0.1)
Basophils Relative: 0 %
Eosinophils Absolute: 0.1 10*3/uL (ref 0.0–0.5)
Eosinophils Relative: 1 %
HCT: 44.1 % (ref 36.0–46.0)
Hemoglobin: 13.5 g/dL (ref 12.0–15.0)
Immature Granulocytes: 0 %
Lymphocytes Relative: 12 %
Lymphs Abs: 1 10*3/uL (ref 0.7–4.0)
MCH: 20.4 pg — ABNORMAL LOW (ref 26.0–34.0)
MCHC: 30.6 g/dL (ref 30.0–36.0)
MCV: 66.5 fL — ABNORMAL LOW (ref 80.0–100.0)
Monocytes Absolute: 0.6 10*3/uL (ref 0.1–1.0)
Monocytes Relative: 6 %
Neutro Abs: 7.2 10*3/uL (ref 1.7–7.7)
Neutrophils Relative %: 81 %
Platelets: 188 10*3/uL (ref 150–400)
RBC: 6.63 MIL/uL — ABNORMAL HIGH (ref 3.87–5.11)
RDW: 22.7 % — ABNORMAL HIGH (ref 11.5–15.5)
WBC: 9 10*3/uL (ref 4.0–10.5)
nRBC: 0 % (ref 0.0–0.2)

## 2019-07-23 LAB — COMPREHENSIVE METABOLIC PANEL
ALT: 45 U/L — ABNORMAL HIGH (ref 0–44)
AST: 60 U/L — ABNORMAL HIGH (ref 15–41)
Albumin: 4 g/dL (ref 3.5–5.0)
Alkaline Phosphatase: 93 U/L (ref 38–126)
Anion gap: 12 (ref 5–15)
BUN: 6 mg/dL (ref 6–20)
CO2: 23 mmol/L (ref 22–32)
Calcium: 9 mg/dL (ref 8.9–10.3)
Chloride: 100 mmol/L (ref 98–111)
Creatinine, Ser: 0.62 mg/dL (ref 0.44–1.00)
GFR calc Af Amer: >60 mL/min (ref >60)
GFR calc non Af Amer: >60 mL/min (ref >60)
Glucose, Bld: 118 mg/dL — ABNORMAL HIGH (ref 70–99)
Potassium: 3 mmol/L — ABNORMAL LOW (ref 3.5–5.1)
Sodium: 135 mmol/L (ref 135–145)
Total Bilirubin: 0.9 mg/dL (ref 0.3–1.2)
Total Protein: 8.5 g/dL — ABNORMAL HIGH (ref 6.5–8.1)

## 2019-07-23 LAB — LIPASE, BLOOD: Lipase: 1141 U/L — ABNORMAL HIGH (ref 11–51)

## 2019-07-23 LAB — I-STAT BETA HCG BLOOD, ED (MC, WL, AP ONLY): I-stat hCG, quantitative: <5 m[IU]/mL (ref <5)

## 2019-07-23 MED ORDER — AMLODIPINE BESYLATE 5 MG PO TABS
5.0000 mg | ORAL_TABLET | Freq: Every day | ORAL | Status: DC
  Administered 2019-07-24 – 2019-07-26 (×3): 5 mg via ORAL
  Filled 2019-07-23 (×3): qty 1

## 2019-07-23 MED ORDER — MORPHINE SULFATE (PF) 4 MG/ML IV SOLN
4.0000 mg | Freq: Once | INTRAVENOUS | Status: AC
  Administered 2019-07-23: 4 mg via INTRAVENOUS
  Filled 2019-07-23: qty 1

## 2019-07-23 MED ORDER — ENOXAPARIN SODIUM 40 MG/0.4ML ~~LOC~~ SOLN
40.0000 mg | Freq: Every day | SUBCUTANEOUS | Status: DC
  Administered 2019-07-24 – 2019-07-25 (×3): 40 mg via SUBCUTANEOUS
  Filled 2019-07-23 (×3): qty 0.4

## 2019-07-23 MED ORDER — POTASSIUM CHLORIDE IN NACL 20-0.9 MEQ/L-% IV SOLN
INTRAVENOUS | Status: DC
  Filled 2019-07-23 (×3): qty 1000

## 2019-07-23 MED ORDER — FAMOTIDINE 20 MG PO TABS
40.0000 mg | ORAL_TABLET | Freq: Every day | ORAL | Status: DC
  Administered 2019-07-24 – 2019-07-26 (×3): 40 mg via ORAL
  Filled 2019-07-23 (×4): qty 2

## 2019-07-23 MED ORDER — THIAMINE HCL 100 MG/ML IJ SOLN
100.0000 mg | Freq: Once | INTRAMUSCULAR | Status: AC
  Administered 2019-07-23: 100 mg via INTRAVENOUS
  Filled 2019-07-23: qty 2

## 2019-07-23 MED ORDER — ACETAMINOPHEN 325 MG PO TABS: 650.0000 mg | ORAL_TABLET | Freq: Four times a day (QID) | ORAL | Status: DC | PRN

## 2019-07-23 MED ORDER — MORPHINE SULFATE (PF) 2 MG/ML IV SOLN
1.0000 mg | INTRAVENOUS | Status: DC | PRN
  Administered 2019-07-24 – 2019-07-25 (×10): 2 mg via INTRAVENOUS
  Filled 2019-07-23 (×10): qty 1

## 2019-07-23 MED ORDER — SODIUM CHLORIDE 0.9 % IV BOLUS
1000.0000 mL | Freq: Once | INTRAVENOUS | Status: AC
  Administered 2019-07-23: 1000 mL via INTRAVENOUS

## 2019-07-23 MED ORDER — ONDANSETRON HCL 4 MG/2ML IJ SOLN
4.0000 mg | Freq: Once | INTRAMUSCULAR | Status: AC
  Administered 2019-07-23: 4 mg via INTRAVENOUS
  Filled 2019-07-23: qty 2

## 2019-07-23 MED ORDER — ONDANSETRON HCL 4 MG/2ML IJ SOLN: 4.0000 mg | Freq: Four times a day (QID) | INTRAMUSCULAR | Status: DC | PRN

## 2019-07-23 MED ORDER — SENNOSIDES-DOCUSATE SODIUM 8.6-50 MG PO TABS
1.0000 | ORAL_TABLET | Freq: Every evening | ORAL | Status: DC | PRN
  Administered 2019-07-24: 21:00:00 1 via ORAL
  Filled 2019-07-23: qty 1

## 2019-07-23 MED ORDER — POTASSIUM CHLORIDE 10 MEQ/100ML IV SOLN
10.0000 meq | INTRAVENOUS | Status: AC
  Administered 2019-07-23 – 2019-07-24 (×4): 10 meq via INTRAVENOUS
  Filled 2019-07-23 (×3): qty 100

## 2019-07-23 MED ORDER — ACETAMINOPHEN 650 MG RE SUPP: 650.0000 mg | Freq: Four times a day (QID) | RECTAL | Status: DC | PRN

## 2019-07-23 MED ORDER — ONDANSETRON HCL 4 MG PO TABS: 4.0000 mg | ORAL_TABLET | Freq: Four times a day (QID) | ORAL | Status: DC | PRN

## 2019-07-23 NOTE — ED Triage Notes (Signed)
Pain in the upper midline abdomen, started yesterday, associated with nausea. Hx of pancreatitis, feels similar.

## 2019-07-23 NOTE — ED Triage Notes (Signed)
Abdominal pain since 8 am yesterday morning.  Patient had nausea and vomiting/dry heaves yesterday morning.  Denies diarrhea.     Pain radiates through to back Patient has a history of pancreatitis, and reports this is how it feels

## 2019-07-23 NOTE — ED Provider Notes (Signed)
[facility]    CSN: [id number] Arrival date & time: 07/23/19  1706      History   Chief Complaint Chief Complaint  Patient presents with  . Abdominal Pain    HPI Laura Hunter is a 39 y.o. female with history of obesity, pancreatitis, alcohol use, hypertension presenting for epigastric pain since 8 AM yesterday morning.  Endorsing constant pain w/ nausea and some vomiting.  Denies projectile emesis, bloody or biliary emesis.  No fever, weakness, lower abdominal pain, diarrhea.  Denying chest pain, difficulty breathing.  Endorsing history of pancreatitis: States this feels similar.   Past Medical History:  Diagnosis Date  . Hypertension   . Obesity   . Pancreatitis     Patient Active Problem List   Diagnosis Date Noted  . Iron deficiency anemia   . Hematochezia   . Acute alcoholic pancreatitis 04/26/2019  . Anemia due to GI blood loss 04/26/2019  . Malpositioned IUD 04/26/2019  . 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 Hypokalemia 04/26/2019    Past Surgical History:  Procedure Laterality Date  . CESAREAN SECTION    . COLONOSCOPY WITH PROPOFOL N/A 04/29/2019   Procedure: COLONOSCOPY WITH PROPOFOL;  Surgeon: Meryl Dare, MD;  Location: [facility] ENDOSCOPY;  Service: Gastroenterology;  Laterality: N/A;  . ESOPHAGOGASTRODUODENOSCOPY (EGD) WITH PROPOFOL N/A 04/29/2019   Procedure: ESOPHAGOGASTRODUODENOSCOPY (EGD) WITH PROPOFOL;  Surgeon: Meryl Dare, MD;  Location: [facility] ENDOSCOPY;  Service: Gastroenterology;  Laterality: N/A;  . HOT HEMOSTASIS N/A 04/29/2019   Procedure: HOT HEMOSTASIS (ARGON PLASMA COAGULATION/BICAP);  Surgeon: Meryl Dare, MD;  Location: [facility] ENDOSCOPY;  Service: Gastroenterology;  Laterality: N/A;  . ROUX-EN-Y GASTRIC BYPASS      OB History   No obstetric history on file.      Home Medications    Prior to Admission medications   Medication Sig Start Date End Date Taking? Authorizing Provider  amLODipine (NORVASC) 5 MG tablet  Take 5 mg by mouth daily.  09/04/18  Yes [provider]  famotidine (PEPCID) 40 MG tablet Take 1 tablet (40 mg total) by mouth daily. 05/01/19  Yes Burnadette Pop, MD  ferrous sulfate 325 (65 FE) MG tablet Take 1 tablet (325 mg total) by mouth daily with breakfast. 05/01/19  Yes Adhikari, Amrit, MD  folic acid (FOLVITE) 1 MG tablet Take 1 tablet (1 mg total) by mouth daily. 05/01/19  Yes Burnadette Pop, MD  hydrochlorothiazide (HYDRODIURIL) 12.5 MG tablet Take 12.5 mg by mouth daily.  09/04/18  Yes [provider]  vitamin B-12 (CYANOCOBALAMIN) 500 MCG tablet Take 1 tablet (500 mcg total) by mouth daily. 05/01/19  Yes Burnadette Pop, MD    Family History Family History  Problem Relation Age of Onset  . HIV/AIDS Mother   . HIV/AIDS Father   . Diabetes Brother     Social History Social History   Tobacco Use  . Smoking status: Former Games developer  . Smokeless tobacco: Never Used  Substance Use Topics  . Alcohol use: Yes    Alcohol/week: 6.0 standard drinks    Types: 6 Shots of liquor per week    Comment: 3 to 6 shots daily of Bacardi  . Drug use: Never     Allergies   Lisinopril   Review of Systems As per HPI   Physical Exam Triage Vital Signs ED Triage Vitals [07/23/19 1714]  Enc Vitals Group     BP (!) 167/116     Pulse Rate 100     Resp 16  Temp 98.2 F (36.8 C)     Temp Source Oral     SpO2 97 %     Weight      Height      Head Circumference      Peak Flow      Pain Score      Pain Loc      Pain Edu?      Excl. in [place]?    No data found.  Updated Vital Signs BP (!) 167/116 (BP Location: Left Arm)   Pulse 100   Temp 98.2 F (36.8 C) (Oral)   Resp 16   LMP 07/21/2019   SpO2 97%   Visual Acuity Right Eye Distance:   Left Eye Distance:   Bilateral Distance:    Right Eye Near:   Left Eye Near:    Bilateral Near:     Physical Exam Constitutional:      Appearance: She is well-developed. She is obese.     Comments: Patient rocking  back and forth in pain, crying  HENT:     Head: Normocephalic and atraumatic.  Eyes:     General: No scleral icterus.    Pupils: Pupils are equal, round, and reactive to light.  Cardiovascular:     Rate and Rhythm: Normal rate.  Pulmonary:     Effort: Pulmonary effort is normal.  Abdominal:     General: Bowel sounds are normal. There is no distension.     Palpations: Abdomen is soft. There is no hepatomegaly or splenomegaly.     Tenderness: There is generalized abdominal tenderness.     Comments: Exam limited  Skin:    Coloration: Skin is not jaundiced or pale.  Neurological:     Mental Status: She is alert and oriented to person, place, and time.      UC Treatments / Results  Labs (all labs ordered are listed, but only abnormal results are displayed) Labs Reviewed - No data to display  EKG   Radiology No results found.  Procedures Procedures (including critical care time)  Medications Ordered in UC Medications - No data to display  Initial Impression / Assessment and Plan / UC Course  I have reviewed the triage vital signs and the nursing notes.  Pertinent labs & imaging results that were available during my care of the patient were reviewed by me and considered in my medical decision making (see chart for details).     Patient afebrile, nontoxic in office today.  Patient is hypertensive: Denies chest pain, palpitations.  Patient has history of recurrent alcoholic pancreatitis, which last required 4-day hospitalization in February 2021.  Given history and patient's exquisite pain, referred to ER for further evaluation.  Electing to self transport with fianc in stable condition. Final Clinical Impressions(s) / UC Diagnoses   Final diagnoses:  Nausea and vomiting, intractability of vomiting not specified, unspecified vomiting type  Generalized abdominal pain  History of pancreatitis   Discharge Instructions   None    ED Prescriptions    None     PDMP not  reviewed this encounter.   Neldon Mc [place], [place] 07/23/19 1946

## 2019-07-23 NOTE — H&P (Signed)
History and Physical    Laura Hunter LFY:[id number] DOB: 1980-12-09 DOA: 07/23/2019  PCP: Patient, No Pcp Per  Patient coming from: Home  I have personally briefly reviewed patient's old medical records in [place]  Chief Complaint: Abdominal pain, nausea, vomiting  HPI: Laura Hunter is a 39 y.o. female with medical history significant for alcohol use disorder with recurrent pancreatitis, hypertension, obesity, status post bariatric surgery who presents to the ED for evaluation of abdominal pain with nausea and vomiting.  Patient was admitted in February 2021 for suspected alcohol induced pancreatitis.  She said she stopped drinking alcohol for about 3 weeks after discharge.  She began to drink occasional beer afterwards and recently went back to drinking liquor.  About 1 week ago she began to have mild abdominal discomfort and decreased oral intake.  Symptoms worsened yesterday with significant epigastric pain which radiated to her back.  She has had nausea with a couple episodes of vomiting.  She has not been eating well but has been able to keep down her medications.  She reports recent hot flashes but no subjective fevers.  She has had shortness of breath with exertion due to her epigastric pain resulting in shallow breathing.  She has had some cramping sensation in her feet described as a charley horse. She denies any chest pain, cough, diarrhea, constipation, dysuria.  ED Course:  Initial vitals showed BP 186/112, pulse 101, RR 17, temp 98.2 Fahrenheit, SPO2 100% on room air.  Labs are notable for lipase 1141, sodium 135, potassium 3.0, bicarb 23, BUN 6, creatinine 0.62, serum glucose 118, AST 60, ALT 45, alk phos 93, total bilirubin 0.9, WBC 9.0, hemoglobin 13.5, platelets 188,000, i-STAT beta-hCG undetectable.  Patient was given IV morphine, Zofran, 1 L normal saline, IV thiamine, and started on IV K 10 mEq x 4 runs.  The hospitalist service was consulted to admit for  further evaluation and management.  Review of Systems: All systems reviewed and are negative except as documented in history of present illness above.   Past Medical History:  Diagnosis Date  . Hypertension   . Obesity   . Pancreatitis     Past Surgical History:  Procedure Laterality Date  . CESAREAN SECTION    . COLONOSCOPY WITH PROPOFOL N/A 04/29/2019   Procedure: COLONOSCOPY WITH PROPOFOL;  Surgeon: Ladene Artist, MD;  Location: [place];  Service: Gastroenterology;  Laterality: N/A;  . ESOPHAGOGASTRODUODENOSCOPY (EGD) WITH PROPOFOL N/A 04/29/2019   Procedure: ESOPHAGOGASTRODUODENOSCOPY (EGD) WITH PROPOFOL;  Surgeon: Ladene Artist, MD;  Location: [place];  Service: Gastroenterology;  Laterality: N/A;  . HOT HEMOSTASIS N/A 04/29/2019   Procedure: HOT HEMOSTASIS (ARGON PLASMA COAGULATION/BICAP);  Surgeon: Ladene Artist, MD;  Location: [place];  Service: Gastroenterology;  Laterality: N/A;  . ROUX-EN-Y GASTRIC BYPASS      Social History:  reports that she has quit smoking. She has never used smokeless tobacco. She reports current alcohol use of about 6.0 standard drinks of alcohol per week. She reports that she does not use drugs.  Allergies  Allergen Reactions  . Lisinopril Cough    Family History  Problem Relation Age of Onset  . HIV/AIDS Mother   . HIV/AIDS Father   . Diabetes Brother      Prior to Admission medications   Medication Sig Start Date End Date Taking? Authorizing Provider  amLODipine (NORVASC) 5 MG tablet Take 5 mg by mouth daily.  09/04/18  Yes [provider]  famotidine (PEPCID) 40 MG  tablet Take 1 tablet (40 mg total) by mouth daily. 05/01/19  Yes Shelly Coss, MD  ferrous sulfate 325 (65 FE) MG tablet Take 1 tablet (325 mg total) by mouth daily with breakfast. 05/01/19  Yes Adhikari, Amrit, MD  folic acid (FOLVITE) 1 MG tablet Take 1 tablet (1 mg total) by mouth daily. 05/01/19  Yes Shelly Coss, MD  hydrochlorothiazide  (HYDRODIURIL) 12.5 MG tablet Take 12.5 mg by mouth daily.  09/04/18  Yes [provider]  vitamin B-12 (CYANOCOBALAMIN) 500 MCG tablet Take 1 tablet (500 mcg total) by mouth daily. 05/01/19  Yes Shelly Coss, MD    Physical Exam: Vitals:   07/23/19 1918 07/23/19 2200  BP: (!) 186/112 (!) 157/90  Pulse: (!) 101 (!) 103  Resp: 17 15  Temp: 98.2 F (36.8 C)   TempSrc: Oral   SpO2: 100% 100%  Weight: 84.4 kg   Height: _0  (1.575 m)    Constitutional: Resting supine in bed, NAD, calm, comfortable Eyes: PERRL, lids and conjunctivae normal ENMT: Mucous membranes are dry. Posterior pharynx clear of any exudate or lesions.Normal dentition.  Neck: normal, supple, no masses. Respiratory: clear to auscultation bilaterally, no wheezing, no crackles. Normal respiratory effort. No accessory muscle use.  Cardiovascular: Regular rate and rhythm, no murmurs / rubs / gallops. No extremity edema. 2+ pedal pulses. Abdomen: Mild epigastric tenderness, no masses palpated. No hepatosplenomegaly. Bowel sounds diminished.  Musculoskeletal: no clubbing / cyanosis. No joint deformity upper and lower extremities. Good ROM, no contractures. Normal muscle tone.  Skin: no rashes, lesions, ulcers. No induration Neurologic: CN 2-12 grossly intact. Sensation intact, Strength 5/5 in all 4.  Psychiatric: Normal judgment and insight. Alert and oriented x 3. Normal mood.   Labs on Admission: I have personally reviewed following labs and imaging studies  CBC: Recent Labs  Lab 07/23/19 2025  WBC 9.0  NEUTROABS 7.2  HGB 13.5  HCT 44.1  MCV 66.5*  PLT 208   Basic Metabolic Panel: Recent Labs  Lab 07/23/19 2025  NA 135  K 3.0*  CL 100  CO2 23  GLUCOSE 118*  BUN 6  CREATININE 0.62  CALCIUM 9.0   GFR: Estimated Creatinine Clearance: 93.2 mL/min (by C-G formula based on SCr of 0.62 mg/dL). Liver Function Tests: Recent Labs  Lab 07/23/19 2025  AST 60*  ALT 45*  ALKPHOS 93  BILITOT 0.9    PROT 8.5*  ALBUMIN 4.0   Recent Labs  Lab 07/23/19 2025  LIPASE 1,141*   No results for input(s): AMMONIA in the last 168 hours. Coagulation Profile: No results for input(s): INR, PROTIME in the last 168 hours. Cardiac Enzymes: No results for input(s): CKTOTAL, CKMB, CKMBINDEX, TROPONINI in the last 168 hours. BNP (last 3 results) No results for input(s): PROBNP in the last 8760 hours. HbA1C: No results for input(s): HGBA1C in the last 72 hours. CBG: No results for input(s): GLUCAP in the last 168 hours. Lipid Profile: No results for input(s): CHOL, HDL, LDLCALC, TRIG, CHOLHDL, LDLDIRECT in the last 72 hours. Thyroid Function Tests: No results for input(s): TSH, T4TOTAL, FREET4, T3FREE, THYROIDAB in the last 72 hours. Anemia Panel: No results for input(s): VITAMINB12, FOLATE, FERRITIN, TIBC, IRON, RETICCTPCT in the last 72 hours. Urine analysis:    Component Value Date/Time   COLORURINE YELLOW 04/26/2019 1510   APPEARANCEUR CLEAR 04/26/2019 1510   LABSPEC 1.011 04/26/2019 1510   PHURINE 6.0 04/26/2019 1510   GLUCOSEU NEGATIVE 04/26/2019 1510   HGBUR SMALL (A) 04/26/2019 1510  BILIRUBINUR NEGATIVE 04/26/2019 [place] 04/26/2019 [place] 04/26/2019 1510   NITRITE NEGATIVE 04/26/2019 [place] 04/26/2019 1510    Radiological Exams on Admission: No results found.  EKG: Not performed.  Assessment/Plan Principal Problem:   Alcohol induced acute pancreatitis Active Problems:   Hypokalemia   Essential hypertension  Laura Hunter is a 39 y.o. female with medical history significant for alcohol use with recurrent pancreatitis, hypertension, obesity, status post bariatric surgery who is admitted with acute pancreatitis.  Recurrent acute pancreatitis: Lipase 1141. Likely alcohol related given recurrence after restarting alcohol use.  She has poor appetite and is not able to maintain adequate oral intake.  We will  continue supportive care. -Start IV fluid hydration overnight -Continue antiemetics and pain control as needed -Keep n.p.o. except for sips with meds/ice chips as tolerated -Obtain abdominal ultrasound  Hypokalemia: Repleted via IV supplementation and adding K to fluids.  Check magnesium and replete as needed.  Hold home HCTZ.  Hypertension: BP slightly elevated in setting of acute pain.  Resume home amlodipine, holding HCTZ as above, continue pain control as needed.  Alcohol use: Reports several weeks abstinence after prior admission for pancreatitis in February 2021 but restarted recently and has had recurrence of her pancreatitis.  She does acknowledge that her recurrent pancreatitis is likely alcohol associated and she is motivated to quit entirely.  She denies any history of alcohol withdrawal. -Continue CIWA checks without as needed benzodiazepines  DVT prophylaxis: Lovenox Code Status: Full code, confirmed with patient Family Communication: Discussed with patient, she has discussed with family Disposition Plan: From home, likely discharge to home pending symptomatic improvement and ability to maintain adequate oral intake Consults called: None Admission status: Status is: Inpatient  Remains inpatient appropriate because:Ongoing active pain requiring inpatient pain management and IV treatments appropriate due to intensity of illness or inability to take PO   Dispo: The patient is from: Home              Anticipated d/c is to: Home              Anticipated d/c date is: 2 days              Patient currently is not medically stable to d/c.  Zada Finders MD [facility]  If 7PM-7AM, please contact night-coverage [email or web address]  07/23/2019, 10:55 PM

## 2019-07-23 NOTE — ED Provider Notes (Signed)
Emergency Department Provider Note   I have reviewed the triage vital signs and the nursing notes.   HISTORY  Chief Complaint Abdominal Pain   HPI Laura Hunter is a 39 y.o. female with PMH of EtOH pancreatitis and HTN presents to the emergency department with return of abdominal pain similar to her pancreatitis in the past.  Patient developed severe symptoms yesterday morning but has been having several days of more mild pain.  She does have some nausea mostly with occasional nonbloody emesis.  She was treated for pancreatitis in February and states that after that episode she did stop drinking for several weeks.  She states that she slowly restarted with light beer and then went back to drinking liquor.  She did not have problems with detox.  She has not entered alcohol treatment but does see a counselor regularly.  She is having mostly epigastric abdominal pain. No fever. No chills. No diarrhea.    Past Medical History:  Diagnosis Date  . Hypertension   . Obesity   . Pancreatitis     Patient Active Problem List   Diagnosis Date Noted  . Alcohol induced acute pancreatitis 07/23/2019  . Essential hypertension 07/23/2019  . Iron deficiency anemia   . Hematochezia   . Acute alcoholic pancreatitis 04/26/2019  . Anemia due to GI blood loss 04/26/2019  . Malpositioned IUD 04/26/2019  . 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 Hypokalemia 04/26/2019    Past Surgical History:  Procedure Laterality Date  . CESAREAN SECTION    . COLONOSCOPY WITH PROPOFOL N/A 04/29/2019   Procedure: COLONOSCOPY WITH PROPOFOL;  Surgeon: Meryl Dare, MD;  [facility]: [facility] ENDOSCOPY;  Service: Gastroenterology;  Laterality: N/A;  . ESOPHAGOGASTRODUODENOSCOPY (EGD) WITH PROPOFOL N/A 04/29/2019   Procedure: ESOPHAGOGASTRODUODENOSCOPY (EGD) WITH PROPOFOL;  Surgeon: Meryl Dare, MD;  Location: [facility] ENDOSCOPY;  Service: Gastroenterology;  Laterality: N/A;  . HOT HEMOSTASIS N/A 04/29/2019   Procedure: HOT HEMOSTASIS (ARGON PLASMA COAGULATION/BICAP);  Surgeon: Meryl Dare, MD;  Location: [facility] ENDOSCOPY;  Service: Gastroenterology;  Laterality: N/A;  . ROUX-EN-Y GASTRIC BYPASS      Allergies Lisinopril  Family History  Problem Relation Age of Onset  . HIV/AIDS Mother   . HIV/AIDS Father   . Diabetes Brother     Social History Social History   Tobacco Use  . Smoking status: Former Games developer  . Smokeless tobacco: Never Used  Substance Use Topics  . Alcohol use: Yes    Alcohol/week: 6.0 standard drinks    Types: 6 Shots of liquor per week    Comment: 3 to 6 shots daily of Bacardi  . Drug use: Never    Review of Systems  Constitutional: No fever/chills Eyes: No visual changes. ENT: No sore throat. Cardiovascular: Denies chest pain. Respiratory: Denies shortness of breath. Gastrointestinal: Positive epigastric abdominal pain. Positive nausea and vomiting.  No diarrhea.  No constipation. Genitourinary: Negative for dysuria. Musculoskeletal: Negative for back pain. Skin: Negative for rash. Neurological: Negative for headaches, focal weakness or numbness.  10-point ROS otherwise negative.  ____________________________________________   PHYSICAL EXAM:  VITAL SIGNS: ED Triage Vitals  Enc Vitals Group     BP 07/23/19 1918 (!) 186/112     Pulse Rate 07/23/19 1918 (!) 101     Resp 07/23/19 1918 17     Temp 07/23/19 1918 98.2 F (36.8 C)     Temp Source 07/23/19 1918 Oral     SpO2 07/23/19 1918 100 %     Weight 07/23/19  1918 186 lb (84.4 kg)     Height 07/23/19 1918 5\' 2"  (1.575 m)   Constitutional: Alert and oriented. Well appearing and in no acute distress. Eyes: Conjunctivae are normal.  Head: Atraumatic. Nose: No congestion/rhinnorhea. Mouth/Throat: Mucous membranes are moist.  Neck: No stridor.   Cardiovascular: Tachycardia. Good peripheral circulation. Grossly normal heart sounds.   Respiratory: Normal respiratory effort.  No retractions. Lungs  CTAB. Gastrointestinal: Soft with mild epigastric tenderness. No rebound or guarding. No distention.  Musculoskeletal: No gross deformities of extremities. Neurologic:  Normal speech and language. Skin:  Skin is warm, dry and intact. No rash noted.  ____________________________________________   LABS (all labs ordered are listed, but only abnormal results are displayed)  Labs Reviewed  COMPREHENSIVE METABOLIC PANEL - Abnormal; Notable for the following components:      Result Value   Potassium 3.0 (*)    Glucose, Bld 118 (*)    Total Protein 8.5 (*)    AST 60 (*)    ALT 45 (*)    All other components within normal limits  LIPASE, BLOOD - Abnormal; Notable for the following components:   Lipase 1,141 (*)    All other components within normal limits  CBC WITH DIFFERENTIAL/PLATELET - Abnormal; Notable for the following components:   RBC 6.63 (*)    MCV 66.5 (*)    MCH 20.4 (*)    RDW 22.7 (*)    All other components within normal limits  RAPID URINE DRUG SCREEN, HOSP PERFORMED - Abnormal; Notable for the following components:   Opiates POSITIVE (*)    All other components within normal limits  CBC - Abnormal; Notable for the following components:   RBC 6.08 (*)    MCV 67.8 (*)    MCH 20.4 (*)    RDW 22.7 (*)    All other components within normal limits  COMPREHENSIVE METABOLIC PANEL - Abnormal; Notable for the following components:   Sodium 134 (*)    Potassium 3.1 (*)    Glucose, Bld 107 (*)    BUN <5 (*)    Calcium 8.4 (*)    AST 49 (*)    All other components within normal limits  RESPIRATORY PANEL BY RT PCR (FLU A&B, COVID)  MAGNESIUM  ETHANOL  MAGNESIUM  I-STAT BETA HCG BLOOD, ED ([facility], [facility], [facility] ONLY)   ____________________________________________  RADIOLOGY  Abdomen Complete  Result Date: 07/24/2019 CLINICAL DATA:  39 year old female with pancreatitis. EXAM: ABDOMEN ULTRASOUND COMPLETE COMPARISON:  Abdominal ultrasound dated 04/28/2019 and CT dated  04/26/2019 FINDINGS: Gallbladder: There is no gallstone, or gallbladder wall thickening. There is a small pericholecystic fluid. Negative sonographic Murphy's sign. Common bile duct: Diameter: 2 mm Liver: There is diffuse increased liver echogenicity most commonly seen in the setting of fatty infiltration. Superimposed inflammation or fibrosis is not excluded. Clinical correlation is recommended. Portal vein is patent on color Doppler imaging with normal direction of blood flow towards the liver. IVC: No abnormality visualized. Pancreas: Visualized portion unremarkable. Spleen: Size and appearance within normal limits. Right Kidney: Length: 14 cm. Normal echogenicity. No hydronephrosis or shadowing stone. Left Kidney: Length: 11 cm. Normal echogenicity. No hydronephrosis or shadowing stone. Abdominal aorta: The visualized portions are unremarkable. Other findings: Evaluation is limited due to body habitus and overlying bowel gas. IMPRESSION: 1. Fatty liver. 2. Small subhepatic ascites. Electronically Signed   By: 06/24/2019 M.D.   On: 07/24/2019 00:17    ____________________________________________   PROCEDURES  Procedure(s) performed:   Procedures  CRITICAL CARE Performed by: Margette Fast Total critical care time: 35 minutes Critical care time was exclusive of separately billable procedures and treating other patients. Critical care was necessary to treat or prevent imminent or life-threatening deterioration. Critical care was time spent personally by me on the following activities: development of treatment plan with patient and/or surrogate as well as nursing, discussions with consultants, evaluation of patient's response to treatment, examination of patient, obtaining history from patient or surrogate, ordering and performing treatments and interventions, ordering and review of laboratory studies, ordering and review of radiographic studies, pulse oximetry and re-evaluation of patient's  condition.  Nanda Quinton, MD Emergency Medicine  ____________________________________________   INITIAL IMPRESSION / ASSESSMENT AND PLAN / ED COURSE  Pertinent labs & imaging results that were available during my care of the patient were reviewed by me and considered in my medical decision making (see chart for details).   Patient presents to the emergency department with epigastric abdominal and back pain with nausea and vomiting.  She states this is exactly the same as her pancreatitis in the past.  She has started drinking alcohol again after stopping for several weeks after her last pancreatitis flare.  No fever.  She is well-appearing.  Vital signs show hypertension and tachycardia likely pain related.  Will give IV fluids, thiamine, morphine, Zofran.  Patient's labs are pending. Patient is not interested in EtOH specific treatment.   Labs reviewed. Lipase > 1,100. COVID negative. Continue IVF and pain meds. Plan on admit. No fever. Abdomen is appropriately tender. Will hold on abdominal CT for now. Plan for admit.   Discussed patient's case with [facility] to request admission. Patient and family (if present) updated with plan. Care transferred to [facility] service.  I reviewed all nursing notes, vitals, pertinent old records, EKGs, labs, imaging (as available).  ____________________________________________  FINAL CLINICAL IMPRESSION(S) / ED DIAGNOSES  Final diagnoses:  Alcohol-induced acute pancreatitis without infection or necrosis  Hypokalemia     MEDICATIONS GIVEN DURING THIS VISIT:  Medications  enoxaparin (LOVENOX) injection 40 mg (40 mg Subcutaneous Given 07/24/19 0013)  acetaminophen (TYLENOL) tablet 650 mg (has no administration in time range)    Or  acetaminophen (TYLENOL) suppository 650 mg (has no administration in time range)  ondansetron (ZOFRAN) tablet 4 mg (has no administration in time range)    Or  ondansetron (ZOFRAN) injection 4 mg (has no administration in time  range)  senna-docusate (Senokot-S) tablet 1 tablet (has no administration in time range)  morphine 2 MG/ML injection 1-2 mg (2 mg Intravenous Given 07/24/19 0934)  amLODipine (NORVASC) tablet 5 mg (5 mg Oral Given 07/24/19 0934)  famotidine (PEPCID) tablet 40 mg (40 mg Oral Given 07/24/19 0934)  0.9 % NaCl with KCl 20 mEq/ L  infusion ( Intravenous Transfusing/Transfer 07/24/19 1204)  sodium chloride 0.9 % bolus 1,000 mL (0 mLs Intravenous Stopped 07/23/19 2311)  thiamine (B-1) injection 100 mg (100 mg Intravenous Given 07/23/19 2026)  morphine 4 MG/ML injection 4 mg (4 mg Intravenous Given 07/23/19 2027)  ondansetron (ZOFRAN) injection 4 mg (4 mg Intravenous Given 07/23/19 2026)  morphine 4 MG/ML injection 4 mg (4 mg Intravenous Given 07/23/19 2159)  potassium chloride 10 mEq in 100 mL IVPB (0 mEq Intravenous Stopping Infusion hung by another clincian 07/24/19 0858)  potassium chloride 10 NOM/767MC IVPB (  Duplicate 11/22/68 9628)     Note:  This document was prepared using Dragon voice recognition software and may include unintentional dictation errors.  Nanda Quinton, MD,  [facility] Emergency Medicine    Tylik Treese, Arlyss Repress, MD 07/24/19 1250

## 2019-07-23 NOTE — ED Notes (Signed)
Ultrasound in with patient before taking upstairs

## 2019-07-24 ENCOUNTER — Encounter (HOSPITAL_COMMUNITY): Payer: Self-pay | Admitting: Internal Medicine

## 2019-07-24 DIAGNOSIS — K852 Alcohol induced acute pancreatitis without necrosis or infection: Principal | ICD-10-CM

## 2019-07-24 LAB — COMPREHENSIVE METABOLIC PANEL
ALT: 36 U/L (ref 0–44)
AST: 49 U/L — ABNORMAL HIGH (ref 15–41)
Albumin: 3.6 g/dL (ref 3.5–5.0)
Alkaline Phosphatase: 81 U/L (ref 38–126)
Anion gap: 8 (ref 5–15)
BUN: <5 mg/dL — ABNORMAL LOW (ref 6–20)
CO2: 25 mmol/L (ref 22–32)
Calcium: 8.4 mg/dL — ABNORMAL LOW (ref 8.9–10.3)
Chloride: 101 mmol/L (ref 98–111)
Creatinine, Ser: 0.5 mg/dL (ref 0.44–1.00)
GFR calc Af Amer: >60 mL/min (ref >60)
GFR calc non Af Amer: >60 mL/min (ref >60)
Glucose, Bld: 107 mg/dL — ABNORMAL HIGH (ref 70–99)
Potassium: 3.1 mmol/L — ABNORMAL LOW (ref 3.5–5.1)
Sodium: 134 mmol/L — ABNORMAL LOW (ref 135–145)
Total Bilirubin: 0.8 mg/dL (ref 0.3–1.2)
Total Protein: 7.7 g/dL (ref 6.5–8.1)

## 2019-07-24 LAB — RAPID URINE DRUG SCREEN, HOSP PERFORMED
Amphetamines: NOT DETECTED
Barbiturates: NOT DETECTED
Benzodiazepines: NOT DETECTED
Cocaine: NOT DETECTED
Opiates: POSITIVE — AB
Tetrahydrocannabinol: NOT DETECTED

## 2019-07-24 LAB — CBC
HCT: 41.2 % (ref 36.0–46.0)
Hemoglobin: 12.4 g/dL (ref 12.0–15.0)
MCH: 20.4 pg — ABNORMAL LOW (ref 26.0–34.0)
MCHC: 30.1 g/dL (ref 30.0–36.0)
MCV: 67.8 fL — ABNORMAL LOW (ref 80.0–100.0)
Platelets: 168 10*3/uL (ref 150–400)
RBC: 6.08 MIL/uL — ABNORMAL HIGH (ref 3.87–5.11)
RDW: 22.7 % — ABNORMAL HIGH (ref 11.5–15.5)
WBC: 7 10*3/uL (ref 4.0–10.5)
nRBC: 0 % (ref 0.0–0.2)

## 2019-07-24 LAB — RESPIRATORY PANEL BY RT PCR (FLU A&B, COVID)
Influenza A by PCR: NEGATIVE
Influenza B by PCR: NEGATIVE
SARS Coronavirus 2 by RT PCR: NEGATIVE

## 2019-07-24 LAB — MAGNESIUM
Magnesium: 1.9 mg/dL (ref 1.7–2.4)
Magnesium: 1.9 mg/dL (ref 1.7–2.4)

## 2019-07-24 LAB — ETHANOL: Alcohol, Ethyl (B): <10 mg/dL (ref <10)

## 2019-07-24 MED ORDER — POTASSIUM CHLORIDE IN NACL 20-0.9 MEQ/L-% IV SOLN
INTRAVENOUS | Status: DC
  Filled 2019-07-24 (×4): qty 1000

## 2019-07-24 MED ORDER — LABETALOL HCL 5 MG/ML IV SOLN
10.0000 mg | Freq: Once | INTRAVENOUS | Status: AC
  Administered 2019-07-24: 10 mg via INTRAVENOUS
  Filled 2019-07-24: qty 4

## 2019-07-24 MED ORDER — POTASSIUM CHLORIDE 10 MEQ/100ML IV SOLN
INTRAVENOUS | Status: AC
  Filled 2019-07-24: qty 100

## 2019-07-24 NOTE — Progress Notes (Signed)
PROGRESS NOTE    Laura Hunter  ENI:[id number] DOB: Jun 03, 1980 DOA: 07/23/2019 PCP: Patient, No Pcp Per    Chief Complaint  Patient presents with  . Abdominal Pain    Brief Narrative:  39 year old African-American female known history of chronic EtOH HTN prior gastric bypass Recent hospitalization 04/26/2019 through 04/30/2019 for pancreatitis-returns to hospital 5/4 with recurrent pancreatitis likely secondary to resumption of alcohol  On admission lipase 1141 sodium 135 potassium 3.0 AST/ALT 60/45 platelets 188-Rx NAD morphine Zofran potassium replaced   Assessment & Plan:   Principal Problem:   Alcohol induced acute pancreatitis Active Problems:   Hypokalemia   Essential hypertension   Recurrent acute pancreatitis .  Lipase very elevated on admission requires high-dose IV fluid 125 cc continue to graduate diet although go slowly as she is still having pain 5/10 Continue morphine today IV and will transition to Percocet when she is reliably taking p.o. in a.m.  Hypokalemia 3.1 Replacing potassium with 20 of K in fluids given runs repeat magnesium and labs a.m.  Mild thrombocytopenia likely secondary to chronic EtOH Patient is contemplative and has good social support I feel she can quit  Morbid obesity BMI 34 status post bypass  HTN Continue amlodipine 5 monitor trends    DVT prophylaxis: Lovenox Code Status: Full Family Communication: None Disposition:   Status is: Inpatient  Remains inpatient appropriate because:Persistent severe electrolyte disturbances, Ongoing active pain requiring inpatient pain management and Unsafe d/c plan   Dispo: The patient is from: Home              Anticipated d/c is to: Home              Anticipated d/c date is: 2 days              Patient currently is not medically stable to d/c.        Consultants:   None  Procedures: None  Antimicrobials: None   Subjective: Awake alert laying in bed watching TV and playing  on her phone-multiple empty glasses at the bedside with 1 with ice in it States overall feels fair but abdominal pain has spasms and is not ready to graduate her diet  Objective: Vitals:   07/24/19 0001 07/24/19 0045 07/24/19 0458 07/24/19 0808  BP: (!) 175/111 (!) 173/99 132/87 (!) 161/103  Pulse: 98  (!) 102 88  Resp: 20  20 18   Temp: 98.8 F (37.1 C)  98.9 F (37.2 C) 97.8 F (36.6 C)  TempSrc: Oral  Oral Oral  SpO2: 100%  100% 98%  Weight: 84.5 kg     Height: 5\' 2"  (1.575 m)      No intake or output data in the 24 hours ending 07/24/19 1100 Filed Weights   07/23/19 1918 07/24/19 0001  Weight: 84.4 kg 84.5 kg    Examination:  General exam: EOMI NCAT no focal deficit Respiratory system: Clinically clear no added sound no rales no rhonchi Cardiovascular system: S1-S2 no murmur rub or gallop Gastrointestinal system: Tender in epigastrium no rebound distended. Central nervous system: No drift no flap Extremities: No lower extremity edema Skin: Intact no rash Psychiatry: Euthymic and pleasant    Data Reviewed: I have personally reviewed following labs and imaging studies Sodium 134 potassium 3.1 magnesium 1.9 AST/ALT 49/3.6 Hemoglobin 12.4 WBC 7.0 platelet 168   Radiology Studies: 09/22/19 Abdomen Complete  Result Date: 07/24/2019 CLINICAL DATA:  39 year old female with pancreatitis. EXAM: ABDOMEN ULTRASOUND COMPLETE COMPARISON:  Abdominal ultrasound dated 04/28/2019 and CT  dated 04/26/2019 FINDINGS: Gallbladder: There is no gallstone, or gallbladder wall thickening. There is a small pericholecystic fluid. Negative sonographic Murphy's sign. Common bile duct: Diameter: 2 mm Liver: There is diffuse increased liver echogenicity most commonly seen in the setting of fatty infiltration. Superimposed inflammation or fibrosis is not excluded. Clinical correlation is recommended. Portal vein is patent on color Doppler imaging with normal direction of blood flow towards the liver. IVC: No  abnormality visualized. Pancreas: Visualized portion unremarkable. Spleen: Size and appearance within normal limits. Right Kidney: Length: 14 cm. Normal echogenicity. No hydronephrosis or shadowing stone. Left Kidney: Length: 11 cm. Normal echogenicity. No hydronephrosis or shadowing stone. Abdominal aorta: The visualized portions are unremarkable. Other findings: Evaluation is limited due to body habitus and overlying bowel gas. IMPRESSION: 1. Fatty liver. 2. Small subhepatic ascites. Electronically Signed   By: Anner Crete M.D.   On: 07/24/2019 00:17      Scheduled Meds: . amLODipine  5 mg Oral Daily  . enoxaparin (LOVENOX) injection  40 mg Subcutaneous QHS  . famotidine  40 mg Oral Daily   Continuous Infusions: . 0.9 % NaCl with KCl 20 mEq / L 125 mL/hr at 07/24/19 0941     LOS: 1 day    Time spent: [place], MD [facility]   To contact the attending provider between 7A-7P or the covering provider during after hours 7P-7A, please log into the web site [email or web address] and access using universal [place] password for that web site. If you do not have the password, please call the hospital operator.  07/24/2019, 11:00 AM

## 2019-07-25 LAB — CBC WITH DIFFERENTIAL/PLATELET
Abs Immature Granulocytes: 0.02 10*3/uL (ref 0.00–0.07)
Basophils Absolute: 0 10*3/uL (ref 0.0–0.1)
Basophils Relative: 1 %
Eosinophils Absolute: 0.1 10*3/uL (ref 0.0–0.5)
Eosinophils Relative: 3 %
HCT: 32.6 % — ABNORMAL LOW (ref 36.0–46.0)
Hemoglobin: 9.8 g/dL — ABNORMAL LOW (ref 12.0–15.0)
Immature Granulocytes: 0 %
Lymphocytes Relative: 24 %
Lymphs Abs: 1.1 10*3/uL (ref 0.7–4.0)
MCH: 20.4 pg — ABNORMAL LOW (ref 26.0–34.0)
MCHC: 30.1 g/dL (ref 30.0–36.0)
MCV: 67.9 fL — ABNORMAL LOW (ref 80.0–100.0)
Monocytes Absolute: 0.4 10*3/uL (ref 0.1–1.0)
Monocytes Relative: 10 %
Neutro Abs: 2.8 10*3/uL (ref 1.7–7.7)
Neutrophils Relative %: 62 %
Platelets: 138 10*3/uL — ABNORMAL LOW (ref 150–400)
RBC: 4.8 MIL/uL (ref 3.87–5.11)
RDW: 22.2 % — ABNORMAL HIGH (ref 11.5–15.5)
WBC: 4.5 10*3/uL (ref 4.0–10.5)
nRBC: 0 % (ref 0.0–0.2)

## 2019-07-25 LAB — COMPREHENSIVE METABOLIC PANEL
ALT: 21 U/L (ref 0–44)
AST: 24 U/L (ref 15–41)
Albumin: 3.1 g/dL — ABNORMAL LOW (ref 3.5–5.0)
Alkaline Phosphatase: 60 U/L (ref 38–126)
Anion gap: 9 (ref 5–15)
BUN: <5 mg/dL — ABNORMAL LOW (ref 6–20)
CO2: 22 mmol/L (ref 22–32)
Calcium: 8.5 mg/dL — ABNORMAL LOW (ref 8.9–10.3)
Chloride: 107 mmol/L (ref 98–111)
Creatinine, Ser: 0.52 mg/dL (ref 0.44–1.00)
GFR calc Af Amer: >60 mL/min (ref >60)
GFR calc non Af Amer: >60 mL/min (ref >60)
Glucose, Bld: 89 mg/dL (ref 70–99)
Potassium: 3.3 mmol/L — ABNORMAL LOW (ref 3.5–5.1)
Sodium: 138 mmol/L (ref 135–145)
Total Bilirubin: 0.5 mg/dL (ref 0.3–1.2)
Total Protein: 6.8 g/dL (ref 6.5–8.1)

## 2019-07-25 MED ORDER — MORPHINE SULFATE (PF) 2 MG/ML IV SOLN
1.0000 mg | INTRAVENOUS | Status: DC | PRN
  Administered 2019-07-26: 2 mg via INTRAVENOUS
  Filled 2019-07-25: qty 1

## 2019-07-25 MED ORDER — OXYCODONE-ACETAMINOPHEN 7.5-325 MG PO TABS
1.0000 | ORAL_TABLET | ORAL | Status: DC | PRN
  Administered 2019-07-25 – 2019-07-26 (×5): 1 via ORAL
  Filled 2019-07-25 (×5): qty 1

## 2019-07-25 NOTE — Progress Notes (Signed)
PROGRESS NOTE    Laura Hunter  NID:[id number] DOB: 02/04/1981 DOA: 07/23/2019 PCP: Patient, No Pcp Per    Chief Complaint  Patient presents with  . Abdominal Pain    Brief Narrative:  39 year old African-American female known history of chronic EtOH HTN prior gastric bypass Recent hospitalization 04/26/2019 through 04/30/2019 for pancreatitis-returns to hospital 5/4 with recurrent pancreatitis likely secondary to resumption of alcohol  On admission lipase 1141 sodium 135 potassium 3.0 AST/ALT 60/45 platelets 188-Rx NAD morphine Zofran potassium replaced   Assessment & Plan:   Principal Problem:   Alcohol induced acute pancreatitis Active Problems:   Hypokalemia   Essential hypertension   Recurrent acute pancreatitis Lipase very elevated on admission requires high-dose IV fluid 125 cc continue to graduate diet Tolerating diet fairly well -- nausea this morning keep in hospital on oral Percocet mainly with breakthrough pain medication morphine changed from every 3 to every 4  Hypokalemia 3.1 Replacing potassium with 20 of K in fluids  Recheck k and Mag am  Mild thrombocytopenia likely secondary to chronic EtOH Patient is contemplative and has good social support I feel she can quit She also has dilutional anemia from IV fluids  Morbid obesity BMI 34 status post bypass  HTN Continue amlodipine 5 monitor trends  DVT prophylaxis: Lovenox Code Status: Full Family Communication: None Disposition:   Status is: Inpatient  Remains inpatient appropriate because:Ongoing active pain requiring inpatient pain management   Dispo: The patient is from: Home              Anticipated d/c is to: Home              Anticipated d/c date is: 1 day              Patient currently is not medically stable to d/c.  Consultants:   None  Procedures: None  Antimicrobials: None   Subjective: Awake coherent no distress EOMI Tells me she is eating but still somewhat nauseous so I  have encouraged her to back down a little bit on her diet today Tells me that she may require time away from work eventually as last time she had pancreatitis she had severe issues and almost lost her job  Objective: Vitals:   07/24/19 1158 07/24/19 1458 07/24/19 1940 07/25/19 0526  BP: (!) 150/102 (!) 161/97 (!) 170/100 (!) 158/101  Pulse: 86 87 90 82  Resp: 18 18 17 18   Temp: 98.5 F (36.9 C) 99.2 F (37.3 C) 99.1 F (37.3 C) 98.6 F (37 C)  TempSrc: Oral Oral Oral Oral  SpO2: 98% 100% 95% 96%  Weight:      Height:        Intake/Output Summary (Last 24 hours) at 07/25/2019 1131 Last data filed at 07/25/2019 1000 Gross per 24 hour  Intake 1749.69 ml  Output --  Net 1749.69 ml   Filed Weights   07/23/19 1918 07/24/19 0001  Weight: 84.4 kg 84.5 kg    Examination:  Exam no change except less tender in abdomen today 5/6  General exam: EOMI NCAT no focal deficit Respiratory system: Clinically clear no added sound no rales no rhonchi Cardiovascular system: S1-S2 no murmur rub or gallop Gastrointestinal system: Tender in epigastrium no rebound distended. Central nervous system: No drift no flap Extremities: No lower extremity edema Skin: Intact no rash Psychiatry: Euthymic and pleasant    Data Reviewed: I have personally reviewed following labs and imaging studies Sodium 134 potassium 3.1-->3.3 AST/ALT 49/3.6---> 24/21 Hemoglobin 12.4-->9.8 WBC  7.0 platelet 168   Radiology Studies: US Abdomen Complete  Result Date: 07/24/2019 CLINICAL DATA:  39 year old female with pancreatitis. EXAM: ABDOMEN ULTRASOUND COMPLETE COMPARISON:  Abdominal ultrasound dated 04/28/2019 and CT dated 04/26/2019 FINDINGS: Gallbladder: There is no gallstone, or gallbladder wall thickening. There is a small pericholecystic fluid. Negative sonographic Murphy's sign. Common bile duct: Diameter: 2 mm Liver: There is diffuse increased liver echogenicity most commonly seen in the setting of fatty  infiltration. Superimposed inflammation or fibrosis is not excluded. Clinical correlation is recommended. Portal vein is patent on color Doppler imaging with normal direction of blood flow towards the liver. IVC: No abnormality visualized. Pancreas: Visualized portion unremarkable. Spleen: Size and appearance within normal limits. Right Kidney: Length: 14 cm. Normal echogenicity. No hydronephrosis or shadowing stone. Left Kidney: Length: 11 cm. Normal echogenicity. No hydronephrosis or shadowing stone. Abdominal aorta: The visualized portions are unremarkable. Other findings: Evaluation is limited due to body habitus and overlying bowel gas. IMPRESSION: 1. Fatty liver. 2. Small subhepatic ascites. Electronically Signed   By: Anner Crete M.D.   On: 07/24/2019 00:17      Scheduled Meds: . amLODipine  5 mg Oral Daily  . enoxaparin (LOVENOX) injection  40 mg Subcutaneous QHS  . famotidine  40 mg Oral Daily   Continuous Infusions: . 0.9 % NaCl with KCl 20 mEq / L 125 mL/hr at 07/24/19 1823     LOS: 2 days    Time spent: 25    Nita Sells, MD [facility]   To contact the attending provider between 7A-7P or the covering provider during after hours 7P-7A, please log into the web site [email or web address] and access using universal [place] password for that web site. If you do not have the password, please call the hospital operator.  07/25/2019, 11:31 AM

## 2019-07-25 NOTE — TOC Initial Note (Signed)
Transition of Care [facility]) - Initial/Assessment Note    Patient Details  Name: Laura Hunter MRN: [id number] Date of Birth: August 23, 1980  Transition of [facility]) CM/SW Contact:    Ida Rogue, LCSW Phone Number: 07/25/2019, 2:51 PM  Clinical Narrative:  RN passed on request by patient for conference.  Ms Mangan stated she needs verification to her insurance company of some information by staff here at the hospital.  She gave me a number, extension and name of worker.  I called in the presence of patient and gave them information requested.  Patient wondered about them accessing her records, and I let her know she would be able to see them in My Chart-with explanation about how to do that in her d/c paperwork.  No other needs identified. TOC will continue to follow during the course of hospitalization.                  Expected Discharge Plan: Home/Self Care Barriers to Discharge: No Barriers Identified   Patient Goals and CMS Choice        Expected Discharge Plan and Services Expected Discharge Plan: Home/Self Care                                              Prior Living Arrangements/Services                       Activities of Daily Living Home Assistive Devices/Equipment: None ADL Screening (condition at time of admission) Patient's cognitive ability adequate to safely complete daily activities?: Yes Is the patient deaf or have difficulty hearing?: No Does the patient have difficulty seeing, even when wearing glasses/contacts?: No Does the patient have difficulty concentrating, remembering, or making decisions?: No Patient able to express need for assistance with ADLs?: Yes Does the patient have difficulty dressing or bathing?: No Independently performs ADLs?: Yes (appropriate for developmental age) Does the patient have difficulty walking or climbing stairs?: No Weakness of Legs: None Weakness of Arms/Hands: None  Permission Sought/Granted                   Emotional Assessment              Admission diagnosis:  Hypokalemia [E87.6] Pancreatitis [K85.90] Alcohol induced acute pancreatitis [K85.20] Alcohol-induced acute pancreatitis without infection or necrosis [K85.20] Patient Active Problem List   Diagnosis Date Noted  . Alcohol induced acute pancreatitis 07/23/2019  . Essential hypertension 07/23/2019  . Iron deficiency anemia   . Hematochezia   . Acute alcoholic pancreatitis 04/26/2019  . Anemia due to GI blood loss 04/26/2019  . Malpositioned IUD 04/26/2019  . 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 Hypokalemia 04/26/2019   PCP:  Patient, No Pcp Per Pharmacy:   [id number] [place], [place] - [phone] AT [facility] & [address] [place] [address] Phone: [phone] Fax: [phone]     Social Determinants of Health (SDOH) Interventions    Readmission Risk Interventions No flowsheet data found.

## 2019-07-26 ENCOUNTER — Encounter: Payer: Self-pay | Admitting: Family Medicine

## 2019-07-26 LAB — CBC WITH DIFFERENTIAL/PLATELET
Abs Immature Granulocytes: 0.02 10*3/uL (ref 0.00–0.07)
Basophils Absolute: 0 10*3/uL (ref 0.0–0.1)
Basophils Relative: 1 %
Eosinophils Absolute: 0.1 10*3/uL (ref 0.0–0.5)
Eosinophils Relative: 3 %
HCT: 35.9 % — ABNORMAL LOW (ref 36.0–46.0)
Hemoglobin: 10.5 g/dL — ABNORMAL LOW (ref 12.0–15.0)
Immature Granulocytes: 0 %
Lymphocytes Relative: 30 %
Lymphs Abs: 1.4 10*3/uL (ref 0.7–4.0)
MCH: 19.8 pg — ABNORMAL LOW (ref 26.0–34.0)
MCHC: 29.2 g/dL — ABNORMAL LOW (ref 30.0–36.0)
MCV: 67.9 fL — ABNORMAL LOW (ref 80.0–100.0)
Monocytes Absolute: 0.5 10*3/uL (ref 0.1–1.0)
Monocytes Relative: 10 %
Neutro Abs: 2.6 10*3/uL (ref 1.7–7.7)
Neutrophils Relative %: 56 %
Platelets: 153 10*3/uL (ref 150–400)
RBC: 5.29 MIL/uL — ABNORMAL HIGH (ref 3.87–5.11)
RDW: 22.2 % — ABNORMAL HIGH (ref 11.5–15.5)
WBC: 4.6 10*3/uL (ref 4.0–10.5)
nRBC: 0 % (ref 0.0–0.2)

## 2019-07-26 LAB — COMPREHENSIVE METABOLIC PANEL
ALT: 24 U/L (ref 0–44)
AST: 33 U/L (ref 15–41)
Albumin: 3.4 g/dL — ABNORMAL LOW (ref 3.5–5.0)
Alkaline Phosphatase: 61 U/L (ref 38–126)
Anion gap: 9 (ref 5–15)
BUN: <5 mg/dL — ABNORMAL LOW (ref 6–20)
CO2: 25 mmol/L (ref 22–32)
Calcium: 8.8 mg/dL — ABNORMAL LOW (ref 8.9–10.3)
Chloride: 103 mmol/L (ref 98–111)
Creatinine, Ser: 0.62 mg/dL (ref 0.44–1.00)
GFR calc Af Amer: >60 mL/min (ref >60)
GFR calc non Af Amer: >60 mL/min (ref >60)
Glucose, Bld: 97 mg/dL (ref 70–99)
Potassium: 3.7 mmol/L (ref 3.5–5.1)
Sodium: 137 mmol/L (ref 135–145)
Total Bilirubin: 0.4 mg/dL (ref 0.3–1.2)
Total Protein: 7.6 g/dL (ref 6.5–8.1)

## 2019-07-26 LAB — PHOSPHORUS: Phosphorus: 3.8 mg/dL (ref 2.5–4.6)

## 2019-07-26 MED ORDER — POLYETHYLENE GLYCOL 3350 17 G PO PACK
17.0000 g | PACK | Freq: Every day | ORAL | Status: DC
  Filled 2019-07-26: qty 1

## 2019-07-26 MED ORDER — OXYCODONE-ACETAMINOPHEN 7.5-325 MG PO TABS: 1.0000 | ORAL_TABLET | ORAL | 0 refills | Status: AC | PRN

## 2019-07-26 MED ORDER — LABETALOL HCL 5 MG/ML IV SOLN
10.0000 mg | Freq: Once | INTRAVENOUS | Status: AC
  Administered 2019-07-26: 10 mg via INTRAVENOUS
  Filled 2019-07-26: qty 4

## 2019-07-26 MED ORDER — OMEPRAZOLE 20 MG PO CPDR: 20.0000 mg | DELAYED_RELEASE_CAPSULE | Freq: Every day | ORAL | 1 refills | Status: DC

## 2019-07-26 NOTE — Discharge Summary (Signed)
Physician Discharge Summary  Theda Payer JKK:[id number] DOB: [birth year] DOA: 07/23/2019  PCP: Patient, No Pcp Per  Admit date: 07/23/2019 Discharge date: 07/26/2019  Time spent: 22 minutes  Recommendations for Outpatient Follow-up:  1. Patient given limited prescription of Percocet for 5-day supply only for abdominal pain 2. Patient has been counseled to quit drinking, quit habits 3. Patient may benefit from outpatient removal of IUD as per her preferences 4.   Discharge Diagnoses:  Principal Problem:   Alcohol induced acute pancreatitis Active Problems:   Hypokalemia   Essential hypertension   Discharge Condition: Improved  Diet recommendation: Heart healthy  Filed Weights   07/23/19 1918 07/24/19 0001  Weight: 84.4 kg 84.5 kg    History of present illness:  39 year old African-American female known history of chronic EtOH HTN prior gastric bypass Recent hospitalization 04/26/2019 through 04/30/2019 for pancreatitis-returns to hospital 5/4 with recurrent pancreatitis likely secondary to resumption of alcohol  On admission lipase 1141 sodium 135 potassium 3.0 AST/ALT 60/45 platelets 188-Rx NAD morphine Zofran potassium replaced   Hospital Course:  Recurrent acute pancreatitis Still nauseous but tolerating diet-at bedside she is eating hamburger and potatoes and I have counseled her on BRAT  diet for the next several days nausea this morning Pain seems manageable with oral meds she is keeping down food so I will discharge her today with small amounts of oral Percocet  Hypokalemia 3.1 on admission resolved to 3.7 no further checks and stable for discharge  Mild thrombocytopenia likely secondary to chronic EtOH Patient is contemplative and has good social support I feel she can quit She also has dilutional anemia from IV fluids  Morbid obesity BMI 34 status post bypass  HTN Continue amlodipine 5 monitor trends    Discharge Exam: Vitals:   07/26/19 0016  07/26/19 0521  BP: (!) 157/109 (!) 157/106  Pulse: 76 72  Resp: 16 16  Temp: 98.1 F (36.7 C) 98.7 F (37.1 C)  SpO2: 98% 100%    General: Awake alert pleasant no distress EOMI NCAT no focal deficit no icterus no pallor mucosa moist Cardiovascular: S1-S2 no murmur rub or gallop  Respiratory: No rales no rhonchi on chest exam Abdomen soft nontender no rebound no guarding  Discharge Instructions   Discharge Instructions    Diet - low sodium heart healthy   Complete by: As directed    Discharge instructions   Complete by: As directed    You will need to follow-up in the outpatient setting with your regular doctor as per your appointments that you have told me that you have made on the 21st/on the 10th with your GYN I would recommend that you get labs done in about 1 week You will need to have a very soft diet and less greasy food and we will start you on a medication to coat your belly called Carafate in addition to Protonix to help reduce your reflux-I do not expect you to be able to eat a regular diet until at least next week sometime and you will have to go very slowly on that diet   Increase activity slowly   Complete by: As directed      Allergies as of 07/26/2019      Reactions   Lisinopril Cough      Medication List    STOP taking these medications   famotidine 40 MG tablet Commonly known as: PEPCID     TAKE these medications   amLODipine 5 MG tablet Commonly known as: NORVASC Take 5  mg by mouth daily.   ferrous sulfate 325 (65 FE) MG tablet Take 1 tablet (325 mg total) by mouth daily with breakfast.   folic acid 1 MG tablet Commonly known as: FOLVITE Take 1 tablet (1 mg total) by mouth daily.   hydrochlorothiazide 12.5 MG tablet Commonly known as: HYDRODIURIL Take 12.5 mg by mouth daily.   omeprazole 20 MG capsule Commonly known as: PriLOSEC Take 1 capsule (20 mg total) by mouth daily.   oxyCODONE-acetaminophen 7.5-325 MG tablet Commonly known as:  PERCOCET Take 1 tablet by mouth every 4 (four) hours as needed for up to 5 days for moderate pain.   vitamin B-12 500 MCG tablet Commonly known as: CYANOCOBALAMIN Take 1 tablet (500 mcg total) by mouth daily.      Allergies  Allergen Reactions  . Lisinopril Cough      The results of significant diagnostics from this hospitalization (including imaging, microbiology, ancillary and laboratory) are listed below for reference.    Significant Diagnostic Studies: US Abdomen Complete  Result Date: 07/24/2019 CLINICAL DATA:  39 year old female with pancreatitis. EXAM: ABDOMEN ULTRASOUND COMPLETE COMPARISON:  Abdominal ultrasound dated 04/28/2019 and CT dated 04/26/2019 FINDINGS: Gallbladder: There is no gallstone, or gallbladder wall thickening. There is a small pericholecystic fluid. Negative sonographic Murphy's sign. Common bile duct: Diameter: 2 mm Liver: There is diffuse increased liver echogenicity most commonly seen in the setting of fatty infiltration. Superimposed inflammation or fibrosis is not excluded. Clinical correlation is recommended. Portal vein is patent on color Doppler imaging with normal direction of blood flow towards the liver. IVC: No abnormality visualized. Pancreas: Visualized portion unremarkable. Spleen: Size and appearance within normal limits. Right Kidney: Length: 14 cm. Normal echogenicity. No hydronephrosis or shadowing stone. Left Kidney: Length: 11 cm. Normal echogenicity. No hydronephrosis or shadowing stone. Abdominal aorta: The visualized portions are unremarkable. Other findings: Evaluation is limited due to body habitus and overlying bowel gas. IMPRESSION: 1. Fatty liver. 2. Small subhepatic ascites. Electronically Signed   By: Elgie Collard M.D.   On: 07/24/2019 00:17    Microbiology: Recent Results (from the past 240 hour(s))  Respiratory Panel by RT PCR (Flu A&B, Covid) - Nasopharyngeal Swab     Status: None   Collection Time: 07/23/19 10:41 PM    Specimen: Nasopharyngeal Swab  Result Value Ref Range Status   SARS Coronavirus 2 by RT PCR NEGATIVE NEGATIVE Final    Comment: (NOTE) SARS-CoV-2 target nucleic acids are NOT DETECTED. The SARS-CoV-2 RNA is generally detectable in upper respiratoy specimens during the acute phase of infection. The lowest concentration of SARS-CoV-2 viral copies this assay can detect is 131 copies/mL. A negative result does not preclude SARS-Cov-2 infection and should not be used as the sole basis for treatment or other patient management decisions. A negative result may occur with  improper specimen collection/handling, submission of specimen other than nasopharyngeal swab, presence of viral mutation(s) within the areas targeted by this assay, and inadequate number of viral copies (<131 copies/mL). A negative result must be combined with clinical observations, patient history, and epidemiological information. The expected result is Negative. Fact Sheet for Patients:  https://www.moore.com/ Fact Sheet for Healthcare Providers:  https://www.young.biz/ This test is not yet ap proved or cleared by the [place] FDA and  has been authorized for detection and/or diagnosis of SARS-CoV-2 by FDA under an Emergency Use Authorization (EUA). This EUA will remain  in effect (meaning this test can be used) for the duration of the COVID-19 declaration under Section 564(b)(1)  of the Act, 21 U.S.C. section 360bbb-3(b)(1), unless the authorization is terminated or revoked sooner.    Influenza A by PCR NEGATIVE NEGATIVE Final   Influenza B by PCR NEGATIVE NEGATIVE Final    Comment: (NOTE) The Xpert Xpress SARS-CoV-2/FLU/RSV assay is intended as an aid in  the diagnosis of influenza from Nasopharyngeal swab specimens and  should not be used as a sole basis for treatment. Nasal washings and  aspirates are unacceptable for Xpert Xpress SARS-CoV-2/FLU/RSV  testing. Fact Sheet  for Patients: https://www.moore.com/ Fact Sheet for Healthcare Providers: https://www.young.biz/ This test is not yet approved or cleared by the [place] FDA and  has been authorized for detection and/or diagnosis of SARS-CoV-2 by  FDA under an Emergency Use Authorization (EUA). This EUA will remain  in effect (meaning this test can be used) for the duration of the  Covid-19 declaration under Section 564(b)(1) of the Act, 21  U.S.C. section 360bbb-3(b)(1), unless the authorization is  terminated or revoked. Performed at [facility], [address], [place], [place] [address]      Labs: Basic Metabolic Panel: Recent Labs  Lab 07/23/19 2025 07/24/19 0036 07/25/19 0545 07/26/19 0508  NA 135 134* 138 137  K 3.0* 3.1* 3.3* 3.7  CL 100 101 107 103  CO2 23 25 22 25   GLUCOSE 118* 107* 89 97  BUN 6 <5* <5* <5*  CREATININE 0.62 0.50 0.52 0.62  CALCIUM 9.0 8.4* 8.5* 8.8*  MG 1.9 1.9  --   --   PHOS  --   --   --  3.8   Liver Function Tests: Recent Labs  Lab 07/23/19 2025 07/24/19 0036 07/25/19 0545 07/26/19 0508  AST 60* 49* 24 33  ALT 45* 36 21 24  ALKPHOS 93 81 60 61  BILITOT 0.9 0.8 0.5 0.4  PROT 8.5* 7.7 6.8 7.6  ALBUMIN 4.0 3.6 3.1* 3.4*   Recent Labs  Lab 07/23/19 2025  LIPASE 1,141*   No results for input(s): AMMONIA in the last 168 hours. CBC: Recent Labs  Lab 07/23/19 2025 07/24/19 0036 07/25/19 0545 07/26/19 0508  WBC 9.0 7.0 4.5 4.6  NEUTROABS 7.2  --  2.8 2.6  HGB 13.5 12.4 9.8* 10.5*  HCT 44.1 41.2 32.6* 35.9*  MCV 66.5* 67.8* 67.9* 67.9*  PLT 188 168 138* 153   Cardiac Enzymes: No results for input(s): CKTOTAL, CKMB, CKMBINDEX, TROPONINI in the last 168 hours. BNP: BNP (last 3 results) No results for input(s): BNP in the last 8760 hours.  ProBNP (last 3 results) No results for input(s): PROBNP in the last 8760 hours.  CBG: No results for input(s): GLUCAP in the last 168  hours.     Signed:  09/25/19 MD   [facility] 07/26/2019, 10:08 AM

## 2019-07-26 NOTE — Progress Notes (Signed)
Patient is being discharged home today. Discharge instructions including medications and follow up appointment given. Pt had no further questions at this time.

## 2019-08-05 ENCOUNTER — Encounter: Payer: Self-pay | Admitting: Internal Medicine

## 2019-08-08 ENCOUNTER — Telehealth (INDEPENDENT_AMBULATORY_CARE_PROVIDER_SITE_OTHER): Payer: BLUE CROSS/BLUE SHIELD | Admitting: Internal Medicine

## 2019-08-08 ENCOUNTER — Encounter: Payer: Self-pay | Admitting: Internal Medicine

## 2019-08-08 DIAGNOSIS — I10 Essential (primary) hypertension: Secondary | ICD-10-CM

## 2019-08-08 DIAGNOSIS — Z09 Encounter for follow-up examination after completed treatment for conditions other than malignant neoplasm: Secondary | ICD-10-CM | POA: Diagnosis not present

## 2019-08-08 DIAGNOSIS — Z9884 Bariatric surgery status: Secondary | ICD-10-CM

## 2019-08-08 DIAGNOSIS — Z30011 Encounter for initial prescription of contraceptive pills: Secondary | ICD-10-CM

## 2019-08-08 DIAGNOSIS — K921 Melena: Secondary | ICD-10-CM

## 2019-08-08 DIAGNOSIS — Z7689 Persons encountering health services in other specified circumstances: Secondary | ICD-10-CM | POA: Diagnosis not present

## 2019-08-08 MED ORDER — AMLODIPINE BESYLATE 10 MG PO TABS: 10.0000 mg | ORAL_TABLET | Freq: Every day | ORAL | 3 refills | Status: DC

## 2019-08-08 MED ORDER — NORETHINDRONE 0.35 MG PO TABS: 1.0000 | ORAL_TABLET | Freq: Every day | ORAL | 11 refills | Status: DC

## 2019-08-08 NOTE — Progress Notes (Signed)
Virtual Visit via Telephone Note  I connected with Laura Hunter, on 08/08/2019 at 2:30 PM by telephone due to the COVID-19 pandemic and verified that I am speaking with the correct person using two identifiers.   Consent: I discussed the limitations, risks, security and privacy concerns of performing an evaluation and management service by telephone and the availability of in person appointments. I also discussed with the patient that there may be a patient responsible charge related to this service. The patient expressed understanding and agreed to proceed.   Location of Patient: Home   Location of Provider: Clinic    Persons participating in Telemedicine visit: Laura Hunter [facility] Dr. Juleen China      History of Present Illness: Patient has a visit to establish care. Patient was hospitalized from 5/4-5/7.   Patient has concerns about her BP. She is taking Amlodipine 5 mg and HCTZ 12.5 mg. Is not currently monitoring BP at home. She is concerned because BP was high in the hospital and has been high at the chiropractor when she has been going since discharge. Says been running 150-160/90s.   Patient reports her IUD was rotated so it was removed at the [facility] office. She wants to talk about other birth control options. She is thinking about trying to conceive later this year, so unsure about another LARC such as Nexplanon.   Recommendations for Outpatient Follow-up:  1. Patient given limited prescription of Percocet for 5-day supply only for abdominal pain 2. Patient has been counseled to quit drinking, quit habits 3. Patient may benefit from outpatient removal of IUD as per her preferences   Hospital Course:  Recurrent acute pancreatitis Still nauseous but tolerating diet-at bedside she is eating hamburger and potatoes and I have counseled her on BRAT  diet for the next several daysnausea this morning Pain seems manageable with oral meds she is keeping down  food so I will discharge her today with small amounts of oral Percocet  Hypokalemia 3.1 on admission resolved to 3.7 no further checks and stable for discharge  Mild thrombocytopenia likely secondary to chronic EtOH Patient is contemplative and has good social support I feel she can quit She also has dilutional anemia from IV fluids  Morbid obesity BMI 34 status post bypass  HTN Continue amlodipine 5 monitor trends   Past Medical History:  Diagnosis Date  . Essential hypertension   . History of abnormal cervical Pap smear   . History of bariatric surgery   . History of diabetes mellitus resolved following bariatric surgery   . Obesity   . Pancreatitis    Allergies  Allergen Reactions  . Lisinopril Cough    Current Outpatient Medications on File Prior to Visit  Medication Sig Dispense Refill  . amLODipine (NORVASC) 5 MG tablet Take 5 mg by mouth daily.     . ferrous sulfate 325 (65 FE) MG tablet Take 1 tablet (325 mg total) by mouth daily with breakfast. 30 tablet 3  . hydrochlorothiazide (HYDRODIURIL) 12.5 MG tablet Take 12.5 mg by mouth daily.     Marland Kitchen omeprazole (PRILOSEC) 20 MG capsule Take 1 capsule (20 mg total) by mouth daily. 30 capsule 1   No current facility-administered medications on file prior to visit.    Observations/Objective: NAD. Speaking clearly.  Work of breathing normal.  Alert and oriented. Mood appropriate.   Assessment and Plan: 1. Encounter to establish care Reviewed patient's PMH, social history, surgical history, and medications.   2. Hospital discharge follow-up Recovering from  pancreatitis. Tolerating diet. Pain well controlled.   3. Essential hypertension BP trend sounds elevated and was at recent hospitalization. Will increase Amlodipine to 10 mg. Hold off on changing HCTZ due to recent illness and electrolyte derrangements while hospitalized. Encouraged patient to purchase a cuff and monitor at home.  Counseled on blood pressure goal  of less than 130/80, low-sodium, DASH diet, medication compliance, 150 minutes of moderate intensity exercise per week. Discussed medication compliance, adverse effects. - amLODipine (NORVASC) 10 MG tablet; Take 1 tablet (10 mg total) by mouth daily.  Dispense: 90 tablet; Refill: 3  4. Encounter for initial prescription of contraceptive pills Discussed contraception options with patient. Given uncontrolled HTN, have recommended against CCPs. Discussed Nexplanon but patient wishes to avoid LARC as she may attempt to conceive in near future. Would also like to avoid Depo for same reason and concern about potential weight gain. Start POPs. Discussed need to take at same time daily to avoid unintended pregnancy and breakthrough bleeding.  - norethindrone (ORTHO MICRONOR) 0.35 MG tablet; Take 1 tablet (0.35 mg total) by mouth daily.  Dispense: 1 Package; Refill: 11  5. History of gastric bypass   Follow Up Instructions: Follow up in 6-8 weeks for HTN    I discussed the assessment and treatment plan with the patient. The patient was provided an opportunity to ask questions and all were answered. The patient agreed with the plan and demonstrated an understanding of the instructions.   The patient was advised to call back or seek an in-person evaluation if the symptoms worsen or if the condition fails to improve as anticipated.     I provided 22 minutes total of non-face-to-face time during this encounter including median intraservice time, reviewing previous notes, investigations, ordering medications, medical decision making, coordinating care and patient verbalized understanding at the end of the visit.    Marcy Siren, D.O. [facility]  08/08/2019, 2:30 PM

## 2019-08-23 ENCOUNTER — Telehealth: Payer: Self-pay | Admitting: General Practice

## 2019-08-23 NOTE — Telephone Encounter (Signed)
OCP was sent to her pharmacy on 08/08/19. Patient notified.

## 2019-08-23 NOTE — Telephone Encounter (Signed)
Pt called stating you where supposed to send birth control

## 2019-10-10 ENCOUNTER — Telehealth: Payer: BLUE CROSS/BLUE SHIELD | Admitting: Internal Medicine

## 2020-07-11 IMAGING — CT CT ABD-PELV W/ CM
2 of 5 series · 16 of 46 positions shown, 18 images · IV contrast (APPLIED)
Comparison: None.

CLINICAL DATA: Cholecystitis.  Dizziness and shortness of breath.

EXAM:
CT ABDOMEN AND PELVIS WITH CONTRAST
TECHNIQUE: Multidetector CT imaging of the abdomen and pelvis was performed
using the standard protocol following bolus administration of
intravenous contrast.
CONTRAST:  100mL OMNIPAQUE IOHEXOL 300 MG/ML  SOLN

[Series 3: abd/ pelvis 5.0 i30f 2 · axial · 0.96mm/px · z∈[+868,+1278]mm · 13 of 92 slices shown, 15 images]
[im 5/92  soft-tissue]
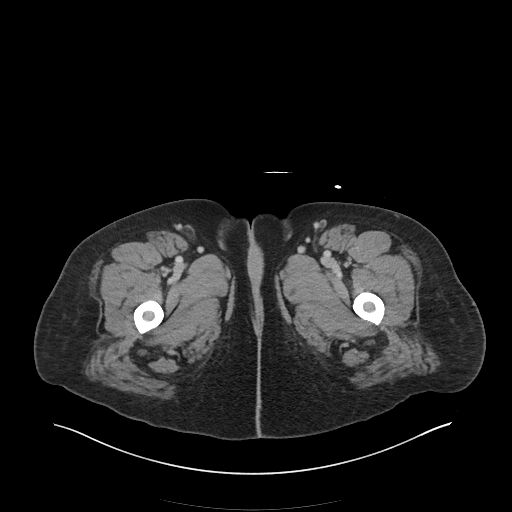
[im 5/92  bone]
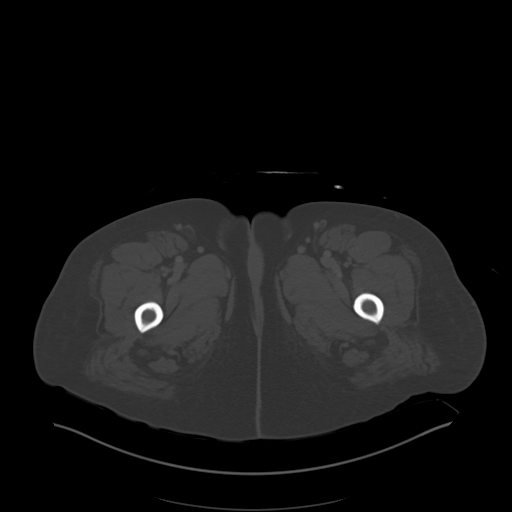
[im 15/92  soft-tissue]
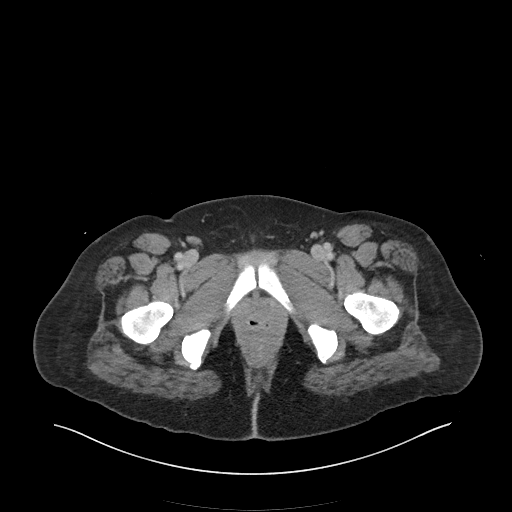
[im 20/92  soft-tissue]
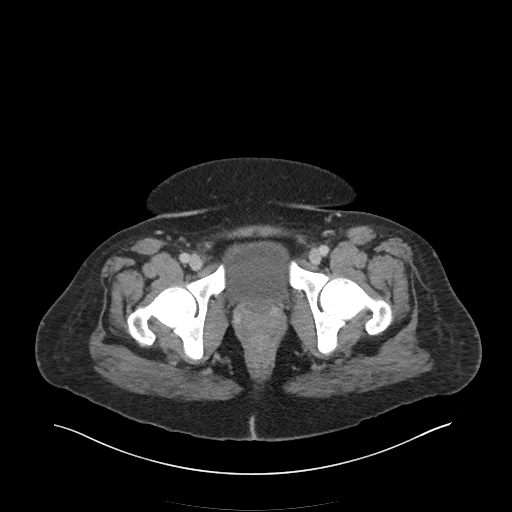
[im 24/92  soft-tissue]
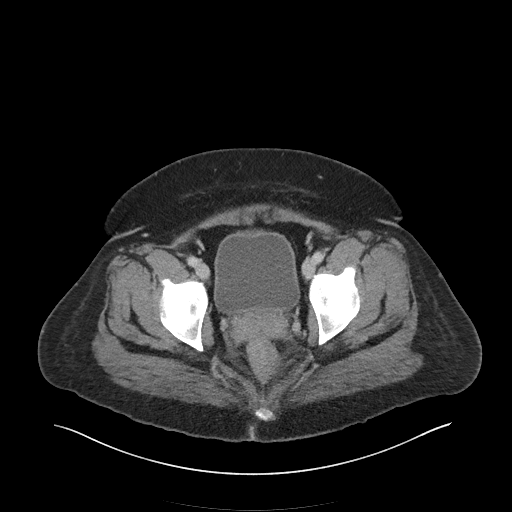
[im 34/92  soft-tissue]
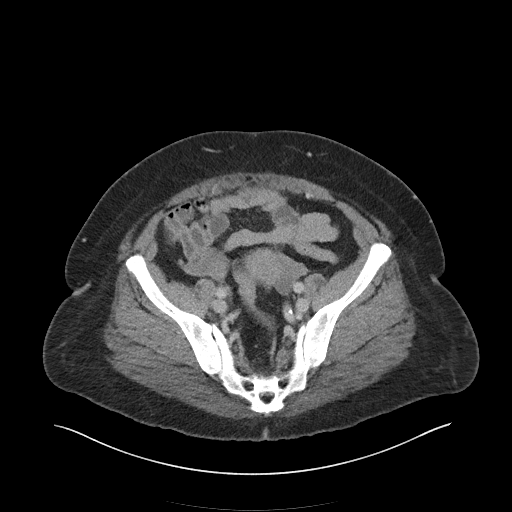
[im 39/92  soft-tissue]
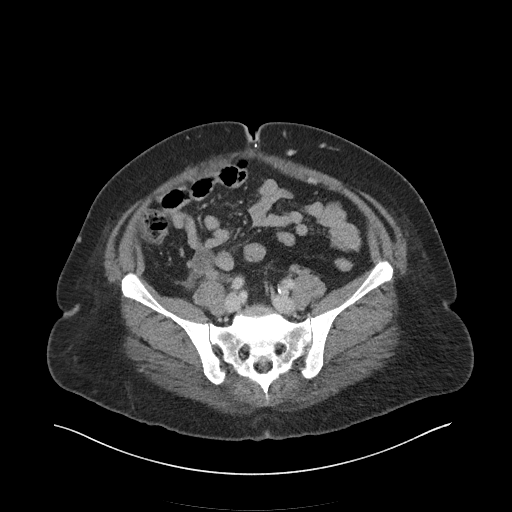
[im 48/92  soft-tissue]
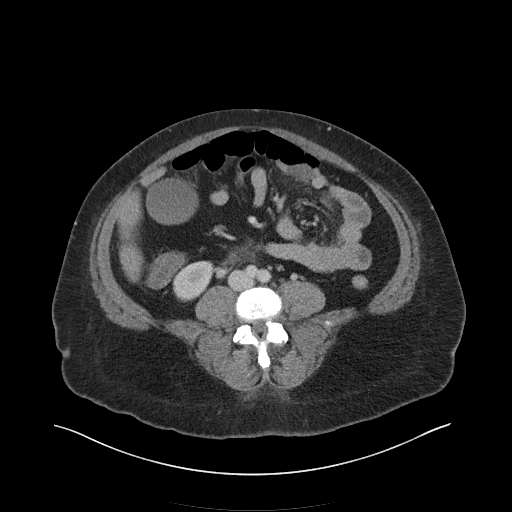
[im 53/92  soft-tissue]
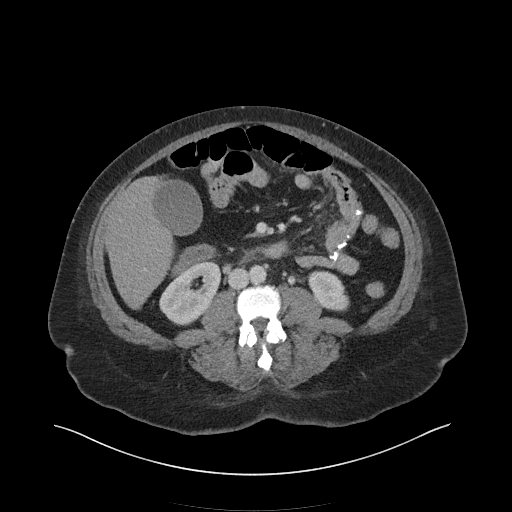
[im 58/92  soft-tissue]
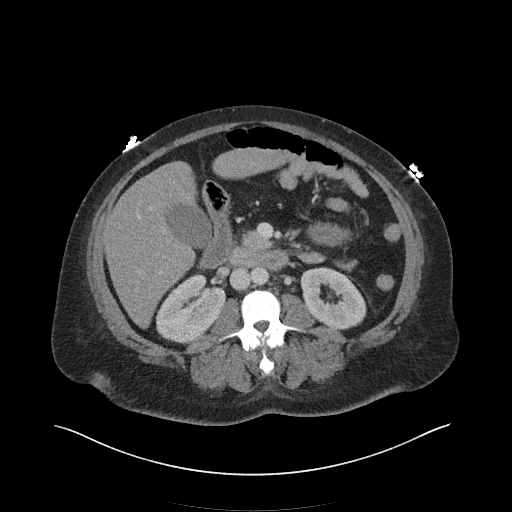
[im 58/92  bone]
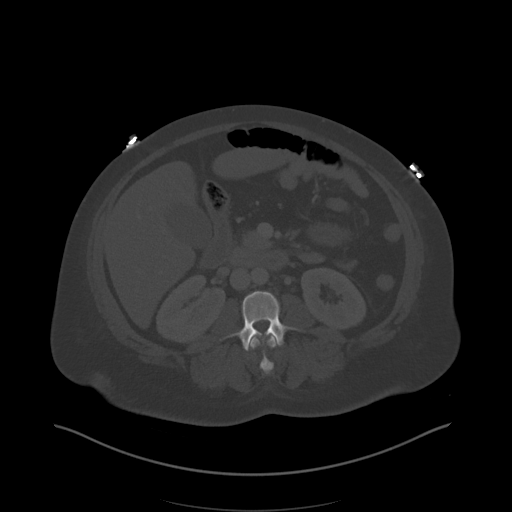
[im 68/92  soft-tissue]
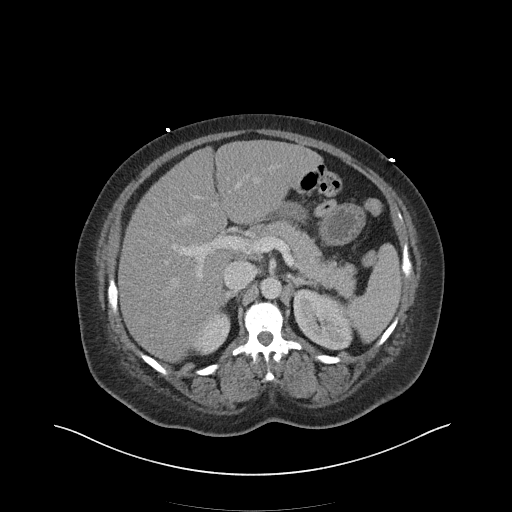
[im 72/92  soft-tissue]
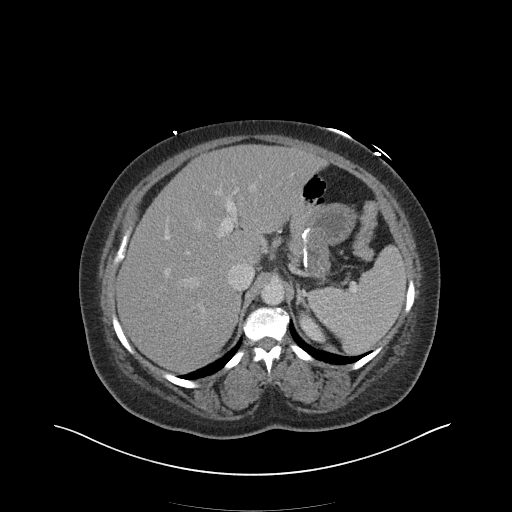
[im 77/92  soft-tissue]
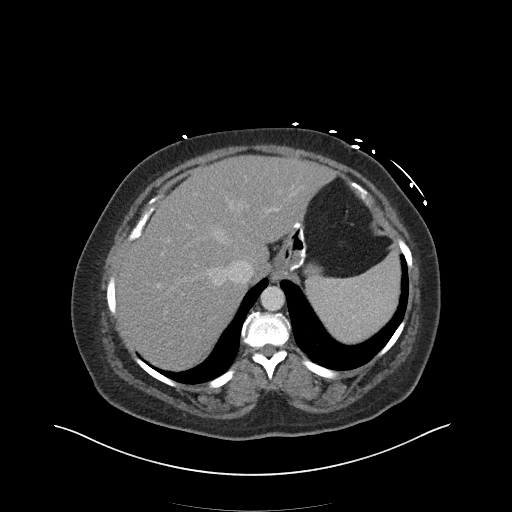
[im 87/92  soft-tissue]
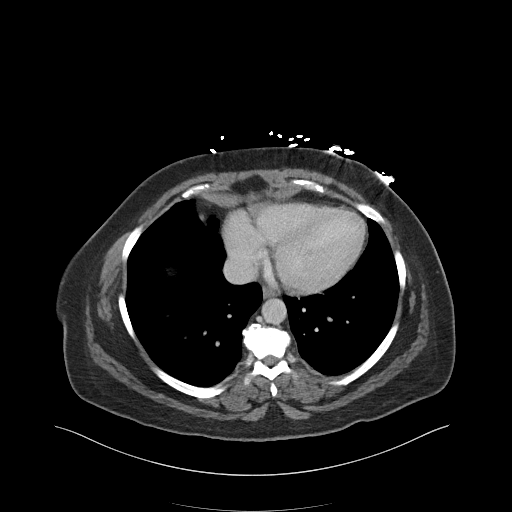

[Series 6: coronal soft tissue · coronal · 0.90mm/px · 3 of 128 slices shown]
[im 43/128  soft-tissue]
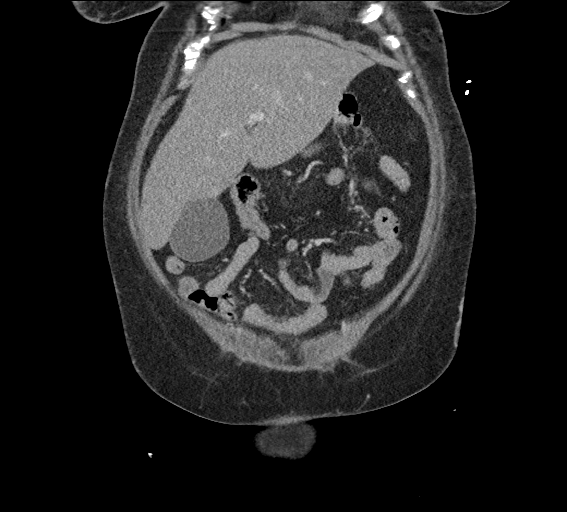
[im 57/128  soft-tissue]
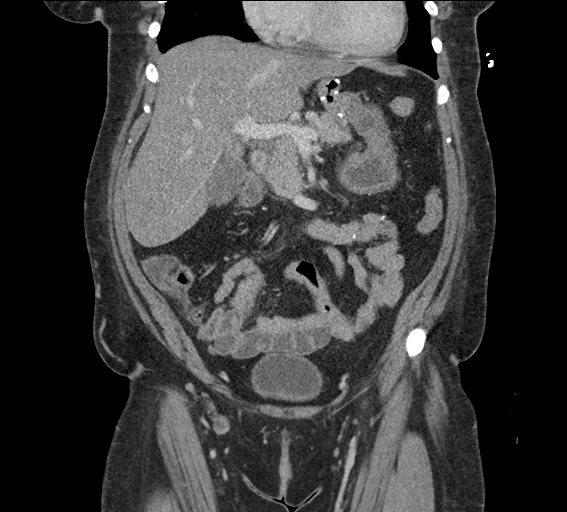
[im 71/128  soft-tissue]
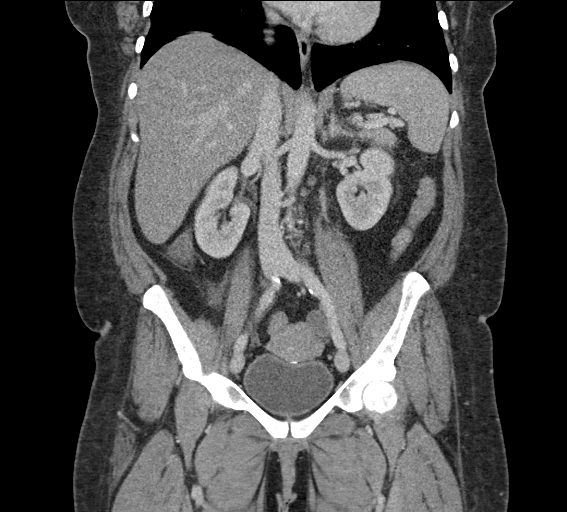

[16 of 46 positions shown; findings below may reference images not displayed]

FINDINGS: Lower chest:  No contributory findings.

Hepatobiliary: Hepatic steatosis.No evidence of biliary obstruction
or stone.

Pancreas: Fat stranding around the head and uncinate process
tracking into the mesentery. There is correlative elevated lipase.
No ductal dilatation or collection.

Spleen: Unremarkable.

Adrenals/Urinary Tract: Negative adrenals. No hydronephrosis or
stone. Unremarkable bladder.

Stomach/Bowel:  No obstruction. No appendicitis.  Gastric bypass.

Vascular/Lymphatic: Mild atherosclerotic plaque. Venous findings
noted below. No mass or adenopathy.

Reproductive:Malpositioned IUD which is rotated with the side-arm
through the lower uterine segment that approaches the serosa. The
stem is at the level of the uterine and cervical junction.
Nonobstructive filling defect the level of the mid right ovarian
vein, see coronal reformats, with some regional fat stranding. The
ovaries themselves appear symmetric.

Other: No ascites or pneumoperitoneum.

Musculoskeletal: No acute abnormalities.  Spondylosis.
IMPRESSION: 1. Mild acute edematous pancreatitis.
2. Partial thrombosis of the right ovarian vein.
3. Malpositioned IUD which is low and rotated with 1 of the side
arms penetrating the lower uterine segment.
4. Hepatic steatosis.
5. Gastric bypass.

## 2020-07-17 DIAGNOSIS — K859 Acute pancreatitis without necrosis or infection, unspecified: Secondary | ICD-10-CM | POA: Insufficient documentation

## 2020-07-17 DIAGNOSIS — E669 Obesity, unspecified: Secondary | ICD-10-CM | POA: Insufficient documentation

## 2020-07-17 DIAGNOSIS — D509 Iron deficiency anemia, unspecified: Secondary | ICD-10-CM

## 2020-12-01 ENCOUNTER — Telehealth: Payer: Self-pay | Admitting: Internal Medicine

## 2020-12-01 NOTE — Telephone Encounter (Signed)
.  [name], groseclose are scheduled for a virtual visit with your provider today.    Just as we do with appointments in the office, we must obtain your consent to participate.  Your consent will be active for this visit and any virtual visit you may have with one of our providers in the next 365 days.    If you have a MyChart account, I can also send a copy of this consent to you electronically.  All virtual visits are billed to your insurance company just like a traditional visit in the office.  As this is a virtual visit, video technology does not allow for your provider to perform a traditional examination.  This may limit your provider's ability to fully assess your condition.  If your provider identifies any concerns that need to be evaluated in person or the need to arrange testing such as labs, EKG, etc, we will make arrangements to do so.    Although advances in technology are sophisticated, we cannot ensure that it will always work on either your end or our end.  If the connection with a video visit is poor, we may have to switch to a telephone visit.  With either a video or telephone visit, we are not always able to ensure that we have a secure connection.   I need to obtain your verbal consent now.   Are you willing to proceed with your visit today?   Laura Hunter has provided verbal consent on 12/01/2020 for a virtual visit (video or telephone).   Katharine Look 12/01/2020  2:47 PM

## 2020-12-02 ENCOUNTER — Ambulatory Visit (INDEPENDENT_AMBULATORY_CARE_PROVIDER_SITE_OTHER): Payer: BLUE CROSS/BLUE SHIELD | Admitting: Nurse Practitioner

## 2020-12-02 ENCOUNTER — Other Ambulatory Visit: Payer: Self-pay

## 2020-12-02 ENCOUNTER — Encounter: Payer: Self-pay | Admitting: Nurse Practitioner

## 2020-12-02 DIAGNOSIS — F419 Anxiety disorder, unspecified: Secondary | ICD-10-CM

## 2020-12-02 DIAGNOSIS — I10 Essential (primary) hypertension: Secondary | ICD-10-CM | POA: Diagnosis not present

## 2020-12-02 DIAGNOSIS — F32A Depression, unspecified: Secondary | ICD-10-CM | POA: Diagnosis not present

## 2020-12-02 MED ORDER — AMLODIPINE BESYLATE 10 MG PO TABS: 10.0000 mg | ORAL_TABLET | Freq: Every day | ORAL | 0 refills | Status: DC

## 2020-12-02 NOTE — Progress Notes (Signed)
Virtual Visit via Telephone Note  I connected with Laura Hunter on 12/02/20 at  9:10 AM EDT by telephone and verified that I am speaking with the correct person using two identifiers.  Location: Patient: home Provider: office   I discussed the limitations, risks, security and privacy concerns of performing an evaluation and management service by telephone and the availability of in person appointments. I also discussed with the patient that there may be a patient responsible charge related to this service. The patient expressed understanding and agreed to proceed.   History of Present Illness:  Patient presents today for medication refill through televisit.  Patient ran out of her amlodipine 3 weeks ago.  She states that her OB/GYN appointment yesterday her blood pressure was 160/100.  Patient has been scheduled a an office visit for follow-up with her PCP in October.  Patient was trying to work on diet and exercises but has some unexpected life changes occur.  This is caused undue stress, anxiety, depression which has led to stress eating.  She did discuss this with her OB/GYN yesterday and was given resources for counseling.  For the past few months she has not been following her diet or exercising.  She wants to get back into her routine.  We discussed that it may be beneficial for her to trial yoga for stress relief and exercise benefit. Denies f/c/s, n/v/d, hemoptysis, PND, chest pain or edema.     Observations/Objective:  Vitals with BMI 07/26/2019 07/26/2019 07/25/2019  Height - - -  Weight - - -  BMI - - -  Systolic 157 157 222  Diastolic 106 109 98  Pulse 72 76 77      Assessment and Plan:  Hypertension:  Will refill amlodipine  Restart heart healthy diet  Start walking routine and yoga   Anxiety and depression:  Please follow up with counselor    Follow up:  Keep upcoming appointment with     I discussed the assessment and treatment plan with the patient.  The patient was provided an opportunity to ask questions and all were answered. The patient agreed with the plan and demonstrated an understanding of the instructions.   The patient was advised to call back or seek an in-person evaluation if the symptoms worsen or if the condition fails to improve as anticipated.  I provided 23 minutes of non-face-to-face time during this encounter.   Ivonne Andrew, NP

## 2020-12-02 NOTE — Patient Instructions (Addendum)
Hypertension:  Will refill amlodipine  Restart heart healthy diet  Start walking routine and yoga   Anxiety and depression:  Please follow up with counselor    Follow up:  Keep upcoming appointment with

## 2020-12-02 NOTE — Progress Notes (Signed)
Pt presents for telemedicine visit for medication refill, pt has been out of meds for approx 1 month. Pt reports BP reading at gyn yesterday was 160/100 Amlodipine refill requested

## 2020-12-29 ENCOUNTER — Ambulatory Visit: Payer: BLUE CROSS/BLUE SHIELD | Admitting: Family Medicine

## 2021-10-05 ENCOUNTER — Encounter: Payer: Self-pay | Admitting: Family Medicine

## 2021-11-01 ENCOUNTER — Encounter: Payer: Self-pay | Admitting: Family Medicine

## 2022-02-16 NOTE — Progress Notes (Signed)
Patient ID: Laura Hunter, female    DOB: 03-26-1980  MRN: [id number]  CC: Hypertension   Subjective: Laura Hunter is a 41 y.o. female who presents for hypertension.   Her concerns today include:  - Has not taken blood pressure medication for at least 7 to 8 months due to needing refills. Reports she was only taking Amlodipine for blood pressure management at that time due to concerns of side effects of Hydrochlorothiazide. She is not checking blood pressures outside of office. Trying to monitor what she eats. Occasional headache. Denies red flag symptoms.  - Request diabetes screening.  - History of anemia and blood transfusion. Was taking over-the-counter iron supplement. Recently noticed she is eating more ice.  - History of gastric bypass in 2012. Highest weight 237 pounds. After surgery lowest weight 139 pounds. Eventually weight plateau of around 150 pounds. Would like to begin weight loss pill. Reports she is planning to begin taking vitamins again.  - Reports she does consume alcohol but not at the capacity of what she used to. States thinks gastric bypass led her to consuming alcohol.  - Request HIV screening. Reports her fiancee passed away 2 years ago and he was HIV positive. She is not sexually active. States since 2 years ago tested twice and both results returned normal.  - Concern for possible yeast infection and/or bacterial vaginosis.    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     ferrous sulfate 325 (65 FE) MG tablet Take 1 tablet (325 mg total) by mouth daily with breakfast. 30 tablet 3   norethindrone (ORTHO MICRONOR) 0.35 MG tablet Take 1 tablet (0.35 mg total) by mouth daily. 1 Package 11   omeprazole (PRILOSEC) 20 MG capsule Take 1 capsule (20 mg total) by mouth daily. 30 capsule 1   No current facility-administered medications on file prior to visit.    Allergies  Allergen Reactions   Latex Other (See Comments)   Lisinopril Cough and Other (See Comments)    COUGH    Social History   Socioeconomic History   Marital status: Single    Spouse name: Not on file   Number of children: Not on file   Years of education: Not on file   Highest education level: Not on file  Occupational History   Not on file  Tobacco Use   Smoking status: Former    Passive exposure: Never   Smokeless tobacco: Never  Substance and Sexual Activity   Alcohol use: Yes    Alcohol/week: 6.0 standard drinks of alcohol    Types: 6 Shots of liquor per week    Comment: 3 to 6 shots daily of Bacardi   Drug use: Never   Sexual activity: Yes    Partners: Male    Birth control/protection: I.U.D.  Other Topics Concern   Not on file  Social History Narrative  Not on file   Social Determinants of Health   Financial Resource Strain: Not on file  Food Insecurity: Not on file  Transportation Needs: Not on file  Physical Activity: Not on file  Stress: Not on file  Social Connections: Not on file  Intimate Partner Violence: Not on file    Family History  Problem Relation Age of Onset   HIV/AIDS Mother    Heart attack Mother    Drug abuse Mother    HIV/AIDS Father    Drug abuse Father    Diabetes Brother    Diabetes Maternal Grandmother    Heart attack Maternal Grandfather     Past Surgical History:  Procedure Laterality Date   BLADDER  REPAIR W/ CESAREAN SECTION     CERVICAL CONE BIOPSY     CESAREAN SECTION     COLONOSCOPY WITH PROPOFOL N/A 04/29/2019   Procedure: COLONOSCOPY WITH PROPOFOL;  Surgeon: Meryl Dare, MD;  Location: [facility] ENDOSCOPY;  Service: Gastroenterology;  Laterality: N/A;   ESOPHAGOGASTRODUODENOSCOPY (EGD) WITH PROPOFOL N/A 04/29/2019   Procedure: ESOPHAGOGASTRODUODENOSCOPY (EGD) WITH PROPOFOL;  Surgeon: Meryl Dare, MD;  Location: [facility] ENDOSCOPY;  Service: Gastroenterology;  Laterality: N/A;   HOT HEMOSTASIS N/A 04/29/2019   Procedure: HOT HEMOSTASIS (ARGON PLASMA COAGULATION/BICAP);  Surgeon: Meryl Dare, MD;  Location: [facility] ENDOSCOPY;  Service: Gastroenterology;  Laterality: N/A;   ROUX-EN-Y GASTRIC BYPASS      ROS: Review of Systems Negative except as stated above  PHYSICAL EXAM: BP (!) 167/93 (BP Location: Right Arm, Patient Position: Sitting, Cuff Size: Large)   Pulse 80   Temp 98.3 F (36.8 C)   Resp 16   Ht 5' 2.01" (1.575 m)   Wt 216 lb (98 kg)   SpO2 96%   BMI 39.50 kg/m   Physical Exam HENT:     Head: Normocephalic and atraumatic.  Eyes:     Extraocular Movements: Extraocular movements intact.     Conjunctiva/sclera: Conjunctivae normal.     Pupils: Pupils are equal, round, and reactive to light.  Cardiovascular:     Rate and Rhythm: Normal rate and regular rhythm.     Pulses: Normal pulses.     Heart sounds: Normal heart sounds.  Pulmonary:     Effort: Pulmonary effort is normal.     Breath sounds: Normal breath sounds.  Musculoskeletal:     Cervical back: Normal range of motion and neck supple.  Neurological:     General: No focal deficit present.     Mental Status: She is alert and oriented to person, place, and time.  Psychiatric:        Mood and Affect: Mood normal.        Behavior: Behavior normal.    Results for orders placed or performed in visit on 02/22/22  POCT URINALYSIS DIP (CLINITEK)  Result Value Ref Range   Color, UA yellow yellow   Clarity, UA  clear clear   Glucose, UA negative negative mg/dL   Bilirubin, UA negative negative   Ketones, POC UA negative negative mg/dL   Spec Grav, UA 6.270 3.500 - 1.025   Blood, UA trace-intact (A) negative   pH, UA 6.0 5.0 - 8.0   POC PROTEIN,UA =30 (A) negative, trace   Urobilinogen, UA 0.2 0.2 or 1.0 E.U./dL   Nitrite, UA Negative Negative   Leukocytes, UA Negative Negative  POCT glycosylated hemoglobin (Hb A1C)  Result Value Ref Range   Hemoglobin A1C 5.9 (A) 4.0 - 5.6 %   HbA1c  POC (<> result, manual entry)     HbA1c, POC (prediabetic range)     HbA1c, POC (controlled diabetic range)       ASSESSMENT AND PLAN: 1. Primary hypertension - Blood pressure not at goal during today's visit. Patient asymptomatic without chest pressure, chest pain, palpitations, shortness of breath, worst headache of life, and any additional red flag symptoms. - Resume Amlodipine and Hydrochlorothiazide as prescribed. Counseled on medication adherence/adverse effects.  - Counseled on blood pressure goal of less than 130/80, low-sodium, DASH diet, medication compliance, and 150 minutes of moderate intensity exercise per week as tolerated. Counseled on medication adherence and adverse effects. - Update BMP. - Follow-up with primary provider in 2 weeks or sooner if needed for blood pressure check.  - Basic Metabolic Panel - amLODipine (NORVASC) 10 MG tablet; Take 1 tablet (10 mg total) by mouth daily.  Dispense: 30 tablet; Refill: 2 - hydrochlorothiazide (HYDRODIURIL) 12.5 MG tablet; Take 1 tablet (12.5 mg total) by mouth daily.  Dispense: 30 tablet; Refill: 2  2. Screening for STD (sexually transmitted disease) - No urinary tract infection.  - Cervicovaginal self-swab to screen for chlamydia, gonorrhea, trichomonas, bacterial vaginitis, and candida vaginitis. - Cervicovaginal ancillary only - POCT URINALYSIS DIP (CLINITEK)  3. Screening for HIV (human immunodeficiency virus) 4. Exposure to HIV - Routine  screening.  - HIV Antibody (routine testing w rflx)  5. Prediabetes - Hemoglobin A1c 5.9% and consistent with prediabetes.  - Discussed the importance of healthy eating habits, low-carbohydrate diet, low-sugar diet, and regular aerobic exercise (at least 150 minutes a week as tolerated) to achieve or maintain control of prediabetes. - Recheck in 6 months.  - POCT glycosylated hemoglobin (Hb A1C)  6. History of anemia - Routine screening.  - CBC  7. Encounter for weight management - Discussed with patient when she returns for blood pressure check in 2 weeks will reassess if appropriate to begin weight loss medication at that time. Patient prefers oral medication. Patient agreeable.     Patient was given the opportunity to ask questions.  Patient verbalized understanding of the plan and was able to repeat key elements of the plan. Patient was given clear instructions to go to Emergency Department or return to medical center if symptoms don't improve, worsen, or new problems develop.The patient verbalized understanding.   Orders Placed This Encounter  Procedures   Basic Metabolic Panel   HIV Antibody (routine testing w rflx)   CBC   POCT URINALYSIS DIP (CLINITEK)   POCT glycosylated hemoglobin (Hb A1C)     Requested Prescriptions   Signed Prescriptions Disp Refills   amLODipine (NORVASC) 10 MG tablet 30 tablet 2    Sig: Take 1 tablet (10 mg total) by mouth daily.   hydrochlorothiazide (HYDRODIURIL) 12.5 MG tablet 30 tablet 2    Sig: Take 1 tablet (12.5 mg total) by mouth daily.    Return in about 2 weeks (around 03/08/2022) for Follow-Up or next available bp check with Georganna Skeans, MD .  Rema Fendt, NP

## 2022-02-22 ENCOUNTER — Encounter: Payer: Self-pay | Admitting: Family

## 2022-02-22 ENCOUNTER — Other Ambulatory Visit (HOSPITAL_COMMUNITY)
Admission: RE | Admit: 2022-02-22 | Discharge: 2022-02-22 | Disposition: A | Discharge status: Home / Self Care | Payer: Self-pay | Source: Ambulatory Visit | Attending: Family | Admitting: Family

## 2022-02-22 ENCOUNTER — Ambulatory Visit (INDEPENDENT_AMBULATORY_CARE_PROVIDER_SITE_OTHER): Payer: Self-pay | Admitting: Family

## 2022-02-22 VITALS — BP 167/93 | HR 80 | Temp 98.3°F | Resp 16 | Ht 62.01 in | Wt 216.0 lb

## 2022-02-22 DIAGNOSIS — R7303 Prediabetes: Secondary | ICD-10-CM

## 2022-02-22 DIAGNOSIS — I10 Essential (primary) hypertension: Secondary | ICD-10-CM

## 2022-02-22 DIAGNOSIS — Z862 Personal history of diseases of the blood and blood-forming organs and certain disorders involving the immune mechanism: Secondary | ICD-10-CM

## 2022-02-22 DIAGNOSIS — Z7689 Persons encountering health services in other specified circumstances: Secondary | ICD-10-CM

## 2022-02-22 DIAGNOSIS — Z113 Encounter for screening for infections with a predominantly sexual mode of transmission: Secondary | ICD-10-CM | POA: Insufficient documentation

## 2022-02-22 DIAGNOSIS — Z206 Contact with and (suspected) exposure to human immunodeficiency virus [HIV]: Secondary | ICD-10-CM

## 2022-02-22 DIAGNOSIS — Z114 Encounter for screening for human immunodeficiency virus [HIV]: Secondary | ICD-10-CM

## 2022-02-22 LAB — POCT URINALYSIS DIP (CLINITEK)
Bilirubin, UA: NEGATIVE
Glucose, UA: NEGATIVE mg/dL
Ketones, POC UA: NEGATIVE mg/dL
Leukocytes, UA: NEGATIVE
Nitrite, UA: NEGATIVE
POC PROTEIN,UA: 30 — AB
Spec Grav, UA: 1.02 (ref 1.010–1.025)
Urobilinogen, UA: 0.2 E.U./dL
pH, UA: 6 (ref 5.0–8.0)

## 2022-02-22 LAB — POCT GLYCOSYLATED HEMOGLOBIN (HGB A1C): Hemoglobin A1C: 5.9 % — AB (ref 4.0–5.6)

## 2022-02-22 MED ORDER — AMLODIPINE BESYLATE 10 MG PO TABS: 10.0000 mg | ORAL_TABLET | Freq: Every day | ORAL | 2 refills | Status: DC

## 2022-02-22 MED ORDER — HYDROCHLOROTHIAZIDE 12.5 MG PO TABS: 12.5000 mg | ORAL_TABLET | Freq: Every day | ORAL | 2 refills | Status: DC

## 2022-02-22 NOTE — Progress Notes (Signed)
.  Pt presents for hypertension f/u   -request HIV testing due to fiance passed away 2 years ago with virus want to make sure she's still negative  -would like to discuss weight loss options  -

## 2022-02-23 ENCOUNTER — Other Ambulatory Visit: Payer: Self-pay | Admitting: Family

## 2022-02-23 ENCOUNTER — Ambulatory Visit: Payer: Self-pay | Admitting: *Deleted

## 2022-02-23 DIAGNOSIS — D509 Iron deficiency anemia, unspecified: Secondary | ICD-10-CM

## 2022-02-23 DIAGNOSIS — B9689 Other specified bacterial agents as the cause of diseases classified elsewhere: Secondary | ICD-10-CM

## 2022-02-23 LAB — CERVICOVAGINAL ANCILLARY ONLY
Bacterial Vaginitis (gardnerella): POSITIVE — AB
Candida Glabrata: NEGATIVE
Candida Vaginitis: NEGATIVE
Chlamydia: NEGATIVE
Comment: NEGATIVE
Comment: NEGATIVE
Comment: NEGATIVE
Comment: NEGATIVE
Comment: NEGATIVE
Comment: NORMAL
Neisseria Gonorrhea: NEGATIVE
Trichomonas: NEGATIVE

## 2022-02-23 LAB — BASIC METABOLIC PANEL
BUN/Creatinine Ratio: 13 (ref 9–23)
BUN: 8 mg/dL (ref 6–24)
CO2: 20 mmol/L (ref 20–29)
Calcium: 9.6 mg/dL (ref 8.7–10.2)
Chloride: 103 mmol/L (ref 96–106)
Creatinine, Ser: 0.6 mg/dL (ref 0.57–1.00)
Glucose: 102 mg/dL — ABNORMAL HIGH (ref 70–99)
Potassium: 4.5 mmol/L (ref 3.5–5.2)
Sodium: 141 mmol/L (ref 134–144)
eGFR: 114 mL/min/{1.73_m2} (ref >59)

## 2022-02-23 LAB — HIV ANTIBODY (ROUTINE TESTING W REFLEX): HIV Screen 4th Generation wRfx: NONREACTIVE

## 2022-02-23 LAB — CBC
Hematocrit: 33.8 % — ABNORMAL LOW (ref 34.0–46.6)
Hemoglobin: 9.2 g/dL — ABNORMAL LOW (ref 11.1–15.9)
MCH: 16.5 pg — ABNORMAL LOW (ref 26.6–33.0)
MCHC: 27.2 g/dL — ABNORMAL LOW (ref 31.5–35.7)
MCV: 61 fL — ABNORMAL LOW (ref 79–97)
Platelets: 209 10*3/uL (ref 150–450)
RBC: 5.56 x10E6/uL — ABNORMAL HIGH (ref 3.77–5.28)
RDW: 24 % — ABNORMAL HIGH (ref 11.7–15.4)
WBC: 7.6 10*3/uL (ref 3.4–10.8)

## 2022-02-23 MED ORDER — METRONIDAZOLE 500 MG PO TABS: 500.0000 mg | ORAL_TABLET | Freq: Two times a day (BID) | ORAL | 0 refills | Status: AC

## 2022-02-23 MED ORDER — FERROUS SULFATE 325 (65 FE) MG PO TABS: 325.0000 mg | ORAL_TABLET | Freq: Every day | ORAL | 2 refills | Status: DC

## 2022-02-23 NOTE — Telephone Encounter (Signed)
Once provider results notes, I will be able to answer pt questions

## 2022-02-23 NOTE — Telephone Encounter (Signed)
Reason for Disposition  [1] Caller requesting NON-URGENT health information AND [2] PCP's office is the best resource  Answer Assessment - Initial Assessment Questions 1. REASON FOR CALL or QUESTION: "What is your reason for calling today?" or "How can I best help you?" or "What question do you have that I can help answer?"     Pt called in with questions regarding her test results on MyChart.  I read her the message from Any Zonia Kief, NP dated 02/22/2022 at 12:35 PM.  She has questions about the blood in her urine and protein also in her urine.    She also wanted to know if she was to take OTC iron or prescription iron.   This was to be decided after her lab work came in.  I let her know the HIV and STD results were not back yet but that someone would call her when they come in.   I let her know she would probably see the results on MyChart before her provider does but that someone would be in contact with her regarding the results.  Protocols used: Information Only Call - No Triage-A-AH

## 2022-05-18 ENCOUNTER — Other Ambulatory Visit: Payer: Self-pay | Admitting: Family

## 2022-05-18 DIAGNOSIS — D509 Iron deficiency anemia, unspecified: Secondary | ICD-10-CM

## 2022-05-18 DIAGNOSIS — I10 Essential (primary) hypertension: Secondary | ICD-10-CM

## 2022-05-18 NOTE — Telephone Encounter (Signed)
Unable to refill per protocol, Rx request was refilled 05/18/22.  Requested Prescriptions  Pending Prescriptions Disp Refills   hydrochlorothiazide (HYDRODIURIL) 12.5 MG tablet [Pharmacy Med Name: HYDROCHLOROTHIAZIDE 12.'5MG'$  TABLETS] 30 tablet 2    Sig: TAKE 1 TABLET(12.5 MG) BY MOUTH DAILY     Cardiovascular: Diuretics - Thiazide Failed - 05/18/2022  9:10 AM      Failed - Last BP in normal range    BP Readings from Last 1 Encounters:  02/22/22 (!) 167/93         Passed - Cr in normal range and within 180 days    Creatinine, Ser  Date Value Ref Range Status  02/22/2022 0.60 0.57 - 1.00 mg/dL Final         Passed - K in normal range and within 180 days    Potassium  Date Value Ref Range Status  02/22/2022 4.5 3.5 - 5.2 mmol/L Final         Passed - Na in normal range and within 180 days    Sodium  Date Value Ref Range Status  02/22/2022 141 134 - 144 mmol/L Final         Passed - Valid encounter within last 6 months    Recent Outpatient Visits           2 months ago Primary hypertension   [place] [facility], [place], NP   1 year ago Essential hypertension   [place] [facility], Kriste Basque, NP   2 years ago Encounter to establish care   [facility] at [facility], Bayard Beaver, MD               FEROSUL 325 (65 Fe) MG tablet [Pharmacy Med Name: FERROUS SULFATE '325MG'$  (5GR) TABS] 30 tablet 2    Sig: TAKE 1 TABLET(325 MG) BY MOUTH DAILY WITH BREAKFAST     Endocrinology:  Minerals - Iron Supplementation Failed - 05/18/2022  9:10 AM      Failed - HGB in normal range and within 360 days    Hemoglobin  Date Value Ref Range Status  02/22/2022 9.2 (L) 11.1 - 15.9 g/dL Final         Failed - HCT in normal range and within 360 days    Hematocrit  Date Value Ref Range Status  02/22/2022 33.8 (L) 34.0 - 46.6 % Final         Failed - RBC in normal range and within 360 days    RBC   Date Value Ref Range Status  02/22/2022 5.56 (H) 3.77 - 5.28 x10E6/uL Final  07/26/2019 5.29 (H) 3.87 - 5.11 MIL/uL Final         Failed - Fe (serum) in normal range and within 360 days    Iron  Date Value Ref Range Status  04/26/2019 196 (H) 28 - 170 ug/dL Final   Saturation Ratios  Date Value Ref Range Status  04/26/2019 47 (H) 10.4 - 31.8 % Final         Failed - Ferritin in normal range and within 360 days    Ferritin  Date Value Ref Range Status  04/26/2019 23 11 - 307 ng/mL Final    Comment:    Performed at [facility], [address], [place], [place] [address]         Passed - Valid encounter within last 12 months    Recent Outpatient Visits  2 months ago Primary hypertension   [place] [facility], [place], NP   1 year ago Essential hypertension   [place] [facility], Kriste Basque, NP   2 years ago Encounter to establish care   [facility], Bayard Beaver, MD               amLODipine (NORVASC) 10 MG tablet [Pharmacy Med Name: AMLODIPINE BESYLATE '10MG'$  TABLETS] 30 tablet 2    Sig: TAKE 1 TABLET(10 MG) BY MOUTH DAILY     Cardiovascular: Calcium Channel Blockers 2 Failed - 05/18/2022  9:10 AM      Failed - Last BP in normal range    BP Readings from Last 1 Encounters:  02/22/22 (!) 167/93         Passed - Last Heart Rate in normal range    Pulse Readings from Last 1 Encounters:  02/22/22 80         Passed - Valid encounter within last 6 months    Recent Outpatient Visits           2 months ago Primary hypertension   [place] [facility], [place], NP   1 year ago Essential hypertension   [place] [facility], Kriste Basque, NP   2 years ago Encounter to establish care   [facility] at [facility], Bayard Beaver, MD

## 2022-05-18 NOTE — Telephone Encounter (Signed)
Requested Prescriptions  Pending Prescriptions Disp Refills   amLODipine (NORVASC) 10 MG tablet [Pharmacy Med Name: AMLODIPINE BESYLATE '10MG'$  TABLETS] 90 tablet 0    Sig: TAKE 1 TABLET(10 MG) BY MOUTH DAILY     Cardiovascular: Calcium Channel Blockers 2 Failed - 05/18/2022  6:38 AM      Failed - Last BP in normal range    BP Readings from Last 1 Encounters:  02/22/22 (!) 167/93         Passed - Last Heart Rate in normal range    Pulse Readings from Last 1 Encounters:  02/22/22 80         Passed - Valid encounter within last 6 months    Recent Outpatient Visits           2 months ago Primary hypertension   [place] [facility], [place], NP   1 year ago Essential hypertension   [place] [facility], Kriste Basque, NP   2 years ago Encounter to establish care   [place] [facility], Bayard Beaver, MD               hydrochlorothiazide (HYDRODIURIL) 12.5 MG tablet [Pharmacy Med Name: HYDROCHLOROTHIAZIDE 12.'5MG'$  TABLETS] 90 tablet 0    Sig: TAKE 1 TABLET(12.5 MG) BY MOUTH DAILY     Cardiovascular: Diuretics - Thiazide Failed - 05/18/2022  6:38 AM      Failed - Last BP in normal range    BP Readings from Last 1 Encounters:  02/22/22 (!) 167/93         Passed - Cr in normal range and within 180 days    Creatinine, Ser  Date Value Ref Range Status  02/22/2022 0.60 0.57 - 1.00 mg/dL Final         Passed - K in normal range and within 180 days    Potassium  Date Value Ref Range Status  02/22/2022 4.5 3.5 - 5.2 mmol/L Final         Passed - Na in normal range and within 180 days    Sodium  Date Value Ref Range Status  02/22/2022 141 134 - 144 mmol/L Final         Passed - Valid encounter within last 6 months    Recent Outpatient Visits           2 months ago Primary hypertension   [place] [facility], [place], NP   1 year ago Essential hypertension    [place] [facility], Kriste Basque, NP   2 years ago Encounter to establish care   [facility] at [facility], Bayard Beaver, MD               FEROSUL 325 (65 Fe) MG tablet [Pharmacy Med Name: FERROUS SULFATE '325MG'$  (5GR) TABS] 90 tablet 0    Sig: TAKE 1 TABLET(325 MG) BY MOUTH DAILY WITH BREAKFAST     Endocrinology:  Minerals - Iron Supplementation Failed - 05/18/2022  6:38 AM      Failed - HGB in normal range and within 360 days    Hemoglobin  Date Value Ref Range Status  02/22/2022 9.2 (L) 11.1 - 15.9 g/dL Final         Failed - HCT in normal range and within 360 days    Hematocrit  Date Value Ref Range Status  02/22/2022 33.8 (  L) 34.0 - 46.6 % Final         Failed - RBC in normal range and within 360 days    RBC  Date Value Ref Range Status  02/22/2022 5.56 (H) 3.77 - 5.28 x10E6/uL Final  07/26/2019 5.29 (H) 3.87 - 5.11 MIL/uL Final         Failed - Fe (serum) in normal range and within 360 days    Iron  Date Value Ref Range Status  04/26/2019 196 (H) 28 - 170 ug/dL Final   Saturation Ratios  Date Value Ref Range Status  04/26/2019 47 (H) 10.4 - 31.8 % Final         Failed - Ferritin in normal range and within 360 days    Ferritin  Date Value Ref Range Status  04/26/2019 23 11 - 307 ng/mL Final    Comment:    Performed at [facility], [place] [address] [place], [place] [address]         Passed - Valid encounter within last 12 months    Recent Outpatient Visits           2 months ago Primary hypertension   [place] [facility], [place], NP   1 year ago Essential hypertension   [place] [facility], Kriste Basque, NP   2 years ago Encounter to establish care   [facility], Bayard Beaver, MD

## 2022-10-13 ENCOUNTER — Telehealth: Payer: Self-pay | Admitting: Family

## 2022-10-13 ENCOUNTER — Other Ambulatory Visit: Payer: Self-pay

## 2022-10-13 DIAGNOSIS — I10 Essential (primary) hypertension: Secondary | ICD-10-CM

## 2022-10-13 MED ORDER — AMLODIPINE BESYLATE 10 MG PO TABS: 10.0000 mg | ORAL_TABLET | Freq: Every day | ORAL | 0 refills | Status: DC

## 2022-10-13 MED ORDER — HYDROCHLOROTHIAZIDE 12.5 MG PO TABS: 12.5000 mg | ORAL_TABLET | Freq: Every day | ORAL | 0 refills | Status: DC

## 2022-10-13 NOTE — Telephone Encounter (Signed)
Patient has appointment scheduled for Next week 8/6 but she is out of her medicine she needs refill for Amlodipine and [facility]othiazide [facility]

## 2022-10-13 NOTE — Telephone Encounter (Signed)
Sent in

## 2022-10-21 ENCOUNTER — Ambulatory Visit: Payer: Self-pay | Admitting: Family

## 2022-10-25 ENCOUNTER — Ambulatory Visit: Payer: Self-pay | Admitting: Family

## 2022-11-14 ENCOUNTER — Other Ambulatory Visit: Payer: Self-pay | Admitting: Family

## 2022-11-14 DIAGNOSIS — I10 Essential (primary) hypertension: Secondary | ICD-10-CM

## 2023-01-06 ENCOUNTER — Other Ambulatory Visit: Payer: Self-pay | Admitting: Family

## 2023-01-06 DIAGNOSIS — I10 Essential (primary) hypertension: Secondary | ICD-10-CM

## 2023-01-06 NOTE — Telephone Encounter (Signed)
Medication Refill - Medication: amLODipine (NORVASC) 10 MG tablet , hydrochlorothiazide (HYDRODIURIL) 12.5 MG tablet   Has the patient contacted their pharmacy? No. No, more refills.  (Agent: If no, request that the patient contact the pharmacy for the refill. If patient does not wish to contact the pharmacy document the reason why and proceed with request.)   Preferred Pharmacy (with phone number or street name):  [id number] - [place], [place] - [address] AT [facility]  [address] [place] [address]  Phone: [phone] Fax: [phone]  Hours: Not open 24 hours   Has the patient been seen for an appointment in the last year OR does the patient have an upcoming appointment? Yes.    Agent: Please be advised that RX refills may take up to 3 business days. We ask that you follow-up with your pharmacy.

## 2023-01-06 NOTE — Telephone Encounter (Signed)
Requested medications are due for refill today.  yes  Requested medications are on the active medications list.  yes  Last refill. Both 10/13/2022 #90 0 rf  Future visit scheduled.   yes  Notes to clinic.  Marcy Siren listed as PCP.    Requested Prescriptions  Pending Prescriptions Disp Refills   amLODipine (NORVASC) 10 MG tablet 90 tablet 0    Sig: Take 1 tablet (10 mg total) by mouth daily.     Cardiovascular: Calcium Channel Blockers 2 Failed - 01/06/2023  1:55 PM      Failed - Last BP in normal range    BP Readings from Last 1 Encounters:  02/22/22 (!) 167/93         Failed - Valid encounter within last 6 months    Recent Outpatient Visits           10 months ago Primary hypertension   [place] [facility], [place], NP   2 years ago Essential hypertension   [place] [facility], Gildardo Pounds, NP   3 years ago Encounter to establish care   [facility] at [facility], Kandee Keen, MD       Future Appointments             In 3 weeks Rema Fendt, NP [facility] - Last Heart Rate in normal range    Pulse Readings from Last 1 Encounters:  02/22/22 80          hydrochlorothiazide (HYDRODIURIL) 12.5 MG tablet 90 tablet 0    Sig: Take 1 tablet (12.5 mg total) by mouth daily.     Cardiovascular: Diuretics - Thiazide Failed - 01/06/2023  1:55 PM      Failed - Cr in normal range and within 180 days    Creatinine, Ser  Date Value Ref Range Status  02/22/2022 0.60 0.57 - 1.00 mg/dL Final         Failed - K in normal range and within 180 days    Potassium  Date Value Ref Range Status  02/22/2022 4.5 3.5 - 5.2 mmol/L Final         Failed - Na in normal range and within 180 days    Sodium  Date Value Ref Range Status  02/22/2022 141 134 - 144 mmol/L Final         Failed - Last BP in normal range    BP Readings from  Last 1 Encounters:  02/22/22 (!) 167/93         Failed - Valid encounter within last 6 months    Recent Outpatient Visits           10 months ago Primary hypertension   [place] [facility], [place], NP   2 years ago Essential hypertension   [place] [facility], Gildardo Pounds, NP   3 years ago Encounter to establish care   [facility] at [facility], Kandee Keen, MD       Future Appointments             In 3 weeks Rema Fendt, NP [facility] at [facility]

## 2023-01-11 ENCOUNTER — Telehealth: Payer: Self-pay

## 2023-01-11 DIAGNOSIS — I10 Essential (primary) hypertension: Secondary | ICD-10-CM

## 2023-01-11 MED ORDER — AMLODIPINE BESYLATE 10 MG PO TABS: 10.0000 mg | ORAL_TABLET | Freq: Every day | ORAL | 0 refills | Status: DC

## 2023-01-11 MED ORDER — HYDROCHLOROTHIAZIDE 12.5 MG PO TABS: 12.5000 mg | ORAL_TABLET | Freq: Every day | ORAL | 0 refills | Status: DC

## 2023-01-11 NOTE — Telephone Encounter (Signed)
Refilled today in the 01/06/23 encounter.

## 2023-01-11 NOTE — Telephone Encounter (Signed)
Medication Refill - Medication:  amLODipine (NORVASC) 10 MG tablet  hydrochlorothiazide (HYDRODIURIL) 12.5 MG tablet   *took last 1 today of each & patient called in a refill request last week on 10.18.2024, see previous rx encounter, shows "pending". Patient states she saw OBGYN today and BP was 136/89.  Has the patient contacted their pharmacy? Yes, advised to contact PCP  Preferred Pharmacy (with phone number or street name):  [id number] [place], [place] - [address] AT [facility]  Phone: [phone] Fax: [phone]   Has the patient been seen for an appointment in the last year OR does the patient have an upcoming appointment?  Yes.

## 2023-01-27 ENCOUNTER — Ambulatory Visit: Payer: Self-pay | Admitting: Family

## 2023-02-20 ENCOUNTER — Ambulatory Visit: Payer: Self-pay | Admitting: Family

## 2023-03-24 ENCOUNTER — Encounter: Payer: Self-pay | Admitting: Family

## 2023-03-24 NOTE — Progress Notes (Signed)
 Erroneous encounter-disregard

## 2023-04-06 ENCOUNTER — Other Ambulatory Visit: Payer: Self-pay | Admitting: Family

## 2023-04-06 DIAGNOSIS — I10 Essential (primary) hypertension: Secondary | ICD-10-CM

## 2023-05-05 ENCOUNTER — Other Ambulatory Visit: Payer: Self-pay | Admitting: Family

## 2023-05-05 DIAGNOSIS — I10 Essential (primary) hypertension: Secondary | ICD-10-CM

## 2023-05-12 ENCOUNTER — Other Ambulatory Visit: Payer: Self-pay | Admitting: Family

## 2023-05-12 DIAGNOSIS — I10 Essential (primary) hypertension: Secondary | ICD-10-CM

## 2023-05-19 ENCOUNTER — Ambulatory Visit (INDEPENDENT_AMBULATORY_CARE_PROVIDER_SITE_OTHER): Payer: BC Managed Care – PPO | Admitting: Family Medicine

## 2023-05-19 ENCOUNTER — Encounter: Payer: Self-pay | Admitting: Family Medicine

## 2023-05-19 VITALS — BP 134/90 | HR 97 | Temp 98.2°F | Resp 16 | Ht 62.0 in | Wt 190.4 lb

## 2023-05-19 DIAGNOSIS — E66811 Obesity, class 1: Secondary | ICD-10-CM

## 2023-05-19 DIAGNOSIS — E6609 Other obesity due to excess calories: Secondary | ICD-10-CM | POA: Diagnosis not present

## 2023-05-19 DIAGNOSIS — I10 Essential (primary) hypertension: Secondary | ICD-10-CM

## 2023-05-19 DIAGNOSIS — Z7689 Persons encountering health services in other specified circumstances: Secondary | ICD-10-CM

## 2023-05-19 DIAGNOSIS — Z6834 Body mass index (BMI) 34.0-34.9, adult: Secondary | ICD-10-CM

## 2023-05-19 MED ORDER — AMLODIPINE BESYLATE 10 MG PO TABS: 10.0000 mg | ORAL_TABLET | Freq: Every day | ORAL | 1 refills | Status: DC

## 2023-05-19 MED ORDER — HYDROCHLOROTHIAZIDE 12.5 MG PO TABS: 12.5000 mg | ORAL_TABLET | Freq: Every day | ORAL | 1 refills | Status: DC

## 2023-05-19 MED ORDER — PHENTERMINE HCL 37.5 MG PO TABS: 37.5000 mg | ORAL_TABLET | Freq: Every day | ORAL | 0 refills | Status: DC

## 2023-05-22 ENCOUNTER — Encounter: Payer: Self-pay | Admitting: Family Medicine

## 2023-05-22 NOTE — Progress Notes (Signed)
 Established Patient Office Visit  Subjective    Patient ID: Laura Hunter, female    DOB: 1980-08-27  Age: 43 y.o. MRN: [id number]  CC:  Chief Complaint  Patient presents with   Follow-up    Medication refilled    HPI Laura Hunter presents for follow up of hypertension and for weight management. Patient denies acute complaints.   Outpatient Encounter Medications as of 05/19/2023  Medication Sig   ferrous sulfate (FEROSUL) 325 (65 FE) MG tablet TAKE 1 TABLET(325 MG) BY MOUTH DAILY WITH BREAKFAST   phentermine (ADIPEX-P) 37.5 MG tablet Take 1 tablet (37.5 mg total) by mouth daily before breakfast.   [DISCONTINUED] amLODipine (NORVASC) 10 MG tablet Take 1 tablet (10 mg total) by mouth daily.   [DISCONTINUED] hydrochlorothiazide (HYDRODIURIL) 12.5 MG tablet Take 1 tablet (12.5 mg total) by mouth daily.   amLODipine (NORVASC) 10 MG tablet Take 1 tablet (10 mg total) by mouth daily.   cyclobenzaprine (FLEXERIL) 5 MG tablet Take 5 mg by mouth 3 (three) times daily as needed.   hydrochlorothiazide (HYDRODIURIL) 12.5 MG tablet Take 1 tablet (12.5 mg total) by mouth daily.   norethindrone (ORTHO MICRONOR) 0.35 MG tablet Take 1 tablet (0.35 mg total) by mouth daily.   omeprazole (PRILOSEC) 20 MG capsule Take 1 capsule (20 mg total) by mouth daily.   No facility-administered encounter medications on file as of 05/19/2023.    Past Medical History:  Diagnosis Date   Essential hypertension    History of abnormal cervical Pap smear    History of bariatric surgery    History of diabetes mellitus resolved following bariatric surgery    Obesity    Pancreatitis     Past Surgical History:  Procedure Laterality Date   BLADDER REPAIR W/ CESAREAN SECTION     CERVICAL CONE BIOPSY     CESAREAN SECTION     COLONOSCOPY WITH PROPOFOL N/A 04/29/2019   Procedure: COLONOSCOPY WITH PROPOFOL;  Surgeon: Meryl Dare, MD;  Location[facility] ENDOSCOPY;  Service: Gastroenterology;  Laterality: N/A;    ESOPHAGOGASTRODUODENOSCOPY (EGD) WITH PROPOFOL N/A 04/29/2019   Procedure: ESOPHAGOGASTRODUODENOSCOPY (EGD) WITH PROPOFOL;  Surgeon: Meryl Dare, MD;  Location: [facility] ENDOSCOPY;  Service: Gastroenterology;  Laterality: N/A;   HOT HEMOSTASIS N/A 04/29/2019   Procedure: HOT HEMOSTASIS (ARGON PLASMA COAGULATION/BICAP);  Surgeon: Meryl Dare, MD;  Location: [facility] ENDOSCOPY;  Service: Gastroenterology;  Laterality: N/A;   ROUX-EN-Y GASTRIC BYPASS      Family History  Problem Relation Age of Onset   HIV/AIDS Mother    Heart attack Mother    Drug abuse Mother    HIV/AIDS Father    Drug abuse Father    Diabetes Brother    Diabetes Maternal Grandmother    Heart attack Maternal Grandfather     Social History   Socioeconomic History   Marital status: Single    Spouse name: Not on file   Number of children: Not on file   Years of education: Not on file   Highest education level: Associate degree: academic program  Occupational History   Not on file  Tobacco Use   Smoking status: Former    Passive exposure: Never   Smokeless tobacco: Never  Substance and Sexual Activity   Alcohol use: Yes    Alcohol/week: 6.0 standard drinks of alcohol    Types: 6 Shots of liquor per week    Comment: 3 to 6 shots daily of Bacardi   Drug use: Never   Sexual activity: Yes  Partners: Male    Birth control/protection: I.U.D.  Other Topics Concern   Not on file  Social History Narrative   Not on file   Social Drivers of Health   Financial Resource Strain: Low Risk  (05/15/2023)   Overall Financial Resource Strain (CARDIA)    Difficulty of Paying Living Expenses: Not hard at all  Food Insecurity: No Food Insecurity (05/15/2023)   Hunger Vital Sign    Worried About Running Out of Food in the Last Year: Never true    Ran Out of Food in the Last Year: Never true  Transportation Needs: No Transportation Needs (05/15/2023)   PRAPARE - Administrator, Civil Service (Medical): No    Lack  of Transportation (Non-Medical): No  Physical Activity: Insufficiently Active (05/15/2023)   Exercise Vital Sign    Days of Exercise per Week: 3 days    Minutes of Exercise per Session: 30 min  Stress: Stress Concern Present (05/15/2023)   [employer] of Occupational Health - Occupational Stress Questionnaire    Feeling of Stress : To some extent  Social Connections: Unknown (05/15/2023)   Social Connection and Isolation Panel [NHANES]    Frequency of Communication with Friends and Family: More than three times a week    Frequency of Social Gatherings with Friends and Family: Twice a week    Attends Religious Services: More than 4 times per year    Active Member of [employer] or Organizations: Yes    Attends Banker Meetings: More than 4 times per year    Marital Status: Patient declined  Intimate Partner Violence: Not At Risk (05/19/2023)   Humiliation, Afraid, Rape, and Kick questionnaire    Fear of Current or Ex-Partner: No    Emotionally Abused: No    Physically Abused: No    Sexually Abused: No    Review of Systems  All other systems reviewed and are negative.       Objective    BP (!) 134/90   Pulse 97   Temp 98.2 F (36.8 C) (Oral)   Resp 16   Ht 5\' 2"  (1.575 m)   Wt 190 lb 6.4 oz (86.4 kg)   SpO2 97%   BMI 34.82 kg/m   Physical Exam Vitals and nursing note reviewed.  Constitutional:      General: She is not in acute distress.    Appearance: She is obese.  Cardiovascular:     Rate and Rhythm: Normal rate and regular rhythm.  Pulmonary:     Effort: Pulmonary effort is normal.     Breath sounds: Normal breath sounds.  Abdominal:     Palpations: Abdomen is soft.     Tenderness: There is no abdominal tenderness.  Neurological:     General: No focal deficit present.     Mental Status: She is alert and oriented to person, place, and time.         Assessment & Plan:   Primary hypertension -     amLODIPine Besylate; Take 1 tablet (10 mg  total) by mouth daily.  Dispense: 90 tablet; Refill: 1 -     hydroCHLOROthiazide; Take 1 tablet (12.5 mg total) by mouth daily.  Dispense: 90 tablet; Refill: 1  Encounter for weight management  Class 1 obesity due to excess calories with serious comorbidity and body mass index (BMI) of 34.0 to 34.9 in adult  Other orders -     Phentermine HCl; Take 1 tablet (37.5 mg total) by mouth daily  before breakfast.  Dispense: 30 tablet; Refill: 0     Return in about 4 weeks (around 06/16/2023) for follow up, weight management.   Tommie Raymond, MD

## 2023-06-05 ENCOUNTER — Telehealth: Payer: Self-pay

## 2023-06-05 NOTE — Telephone Encounter (Signed)
 Can you give me a hand witht his prior auth please?  Phentermine HCI 37.5MG  Tablets  Thank you

## 2023-06-06 ENCOUNTER — Other Ambulatory Visit: Payer: Self-pay

## 2023-06-07 ENCOUNTER — Telehealth: Payer: Self-pay

## 2023-06-07 ENCOUNTER — Other Ambulatory Visit: Payer: Self-pay

## 2023-06-07 NOTE — Telephone Encounter (Signed)
 Pharmacy Patient Advocate Encounter  Received notification from [facility] that Prior Authorization for PHENTERMINE has been APPROVED from 06/06/2023 to 06/05/2024   PA #/Case ID/Reference #: [id number]  Per [facility], patient picked up medication on 05/22/2023

## 2023-06-19 ENCOUNTER — Encounter: Payer: Self-pay | Admitting: Family Medicine

## 2023-06-19 ENCOUNTER — Ambulatory Visit: Payer: BC Managed Care – PPO | Admitting: Family Medicine

## 2023-06-19 VITALS — BP 119/83 | HR 77 | Temp 98.3°F | Resp 16 | Ht 62.0 in | Wt 182.8 lb

## 2023-06-19 DIAGNOSIS — I10 Essential (primary) hypertension: Secondary | ICD-10-CM

## 2023-06-19 DIAGNOSIS — E6609 Other obesity due to excess calories: Secondary | ICD-10-CM | POA: Diagnosis not present

## 2023-06-19 DIAGNOSIS — E66811 Obesity, class 1: Secondary | ICD-10-CM

## 2023-06-19 DIAGNOSIS — Z6833 Body mass index (BMI) 33.0-33.9, adult: Secondary | ICD-10-CM

## 2023-06-19 DIAGNOSIS — Z7689 Persons encountering health services in other specified circumstances: Secondary | ICD-10-CM

## 2023-06-19 MED ORDER — PHENTERMINE HCL 37.5 MG PO TABS: 37.5000 mg | ORAL_TABLET | Freq: Every day | ORAL | 0 refills | Status: DC

## 2023-06-20 ENCOUNTER — Encounter: Payer: Self-pay | Admitting: Family Medicine

## 2023-06-20 NOTE — Progress Notes (Signed)
 Established Patient Office Visit  Subjective    Patient ID: Laura Hunter, female    DOB: 1981-03-06  Age: 43 y.o. MRN: [id number]  CC:  Chief Complaint  Patient presents with   Weight Check    HPI Laura Hunter presents for routine weight management as well as follow up of hypertension. Patient reports med compliance and denies acute complaints.   Outpatient Encounter Medications as of 06/19/2023  Medication Sig   amLODipine (NORVASC) 10 MG tablet Take 1 tablet (10 mg total) by mouth daily.   ferrous sulfate (FEROSUL) 325 (65 FE) MG tablet TAKE 1 TABLET(325 MG) BY MOUTH DAILY WITH BREAKFAST   hydrochlorothiazide (HYDRODIURIL) 12.5 MG tablet Take 1 tablet (12.5 mg total) by mouth daily.   [DISCONTINUED] phentermine (ADIPEX-P) 37.5 MG tablet Take 1 tablet (37.5 mg total) by mouth daily before breakfast.   phentermine (ADIPEX-P) 37.5 MG tablet Take 1 tablet (37.5 mg total) by mouth daily before breakfast.   No facility-administered encounter medications on file as of 06/19/2023.    Past Medical History:  Diagnosis Date   Essential hypertension    History of abnormal cervical Pap smear    History of bariatric surgery    History of diabetes mellitus resolved following bariatric surgery    Obesity    Pancreatitis     Past Surgical History:  Procedure Laterality Date   BLADDER REPAIR W/ CESAREAN SECTION     CERVICAL CONE BIOPSY     CESAREAN SECTION     COLONOSCOPY WITH PROPOFOL N/A 04/29/2019   Procedure: COLONOSCOPY WITH PROPOFOL;  Surgeon: Meryl Dare, MD;  Location: [facility] ENDOSCOPY;  Service: Gastroenterology;  Laterality: N/A;   ESOPHAGOGASTRODUODENOSCOPY (EGD) WITH PROPOFOL N/A 04/29/2019   Procedure: ESOPHAGOGASTRODUODENOSCOPY (EGD) WITH PROPOFOL;  Surgeon: Meryl Dare, MD;  Location: [facility] ENDOSCOPY;  Service: Gastroenterology;  Laterality: N/A;   HOT HEMOSTASIS N/A 04/29/2019   Procedure: HOT HEMOSTASIS (ARGON PLASMA COAGULATION/BICAP);  Surgeon: Meryl Dare, MD;  Location: [facility] ENDOSCOPY;  Service: Gastroenterology;  Laterality: N/A;   ROUX-EN-Y GASTRIC BYPASS      Family History  Problem Relation Age of Onset   HIV/AIDS Mother    Heart attack Mother    Drug abuse Mother    HIV/AIDS Father    Drug abuse Father    Diabetes Brother    Diabetes Maternal Grandmother    Heart attack Maternal Grandfather     Social History   Socioeconomic History   Marital status: Single    Spouse name: Not on file   Number of children: Not on file   Years of education: Not on file   Highest education level: Associate degree: academic program  Occupational History   Not on file  Tobacco Use   Smoking status: Former    Passive exposure: Never   Smokeless tobacco: Never  Vaping Use   Vaping status: Never Used  Substance and Sexual Activity   Alcohol use: Yes    Alcohol/week: 6.0 standard drinks of alcohol    Types: 6 Shots of liquor per week    Comment: 3 to 6 shots daily of Bacardi   Drug use: Never   Sexual activity: Yes    Partners: Male    Birth control/protection: I.U.D.  Other Topics Concern   Not on file  Social History Narrative   Not on file   Social Drivers of Health   Financial Resource Strain: Low Risk  (05/15/2023)   Overall Financial Resource Strain (CARDIA)    Difficulty of  Paying Living Expenses: Not hard at all  Food Insecurity: No Food Insecurity (05/15/2023)   Hunger Vital Sign    Worried About Running Out of Food in the Last Year: Never true    Ran Out of Food in the Last Year: Never true  Transportation Needs: No Transportation Needs (05/15/2023)   PRAPARE - Administrator, Civil Service (Medical): No    Lack of Transportation (Non-Medical): No  Physical Activity: Insufficiently Active (05/15/2023)   Exercise Vital Sign    Days of Exercise per Week: 3 days    Minutes of Exercise per Session: 30 min  Stress: Stress Concern Present (05/15/2023)   [employer] of Occupational Health - Occupational  Stress Questionnaire    Feeling of Stress : To some extent  Social Connections: Unknown (05/15/2023)   Social Connection and Isolation Panel [NHANES]    Frequency of Communication with Friends and Family: More than three times a week    Frequency of Social Gatherings with Friends and Family: Twice a week    Attends Religious Services: More than 4 times per year    Active Member of [employer] or Organizations: Yes    Attends Banker Meetings: More than 4 times per year    Marital Status: Patient declined  Intimate Partner Violence: Not At Risk (05/19/2023)   Humiliation, Afraid, Rape, and Kick questionnaire    Fear of Current or Ex-Partner: No    Emotionally Abused: No    Physically Abused: No    Sexually Abused: No    Review of Systems  All other systems reviewed and are negative.       Objective    BP 119/83   Pulse 77   Temp 98.3 F (36.8 C) (Oral)   Resp 16   Ht 5\' 2"  (1.575 m)   Wt 182 lb 12.8 oz (82.9 kg)   SpO2 96%   BMI 33.43 kg/m   Physical Exam Vitals and nursing note reviewed.  Constitutional:      General: She is not in acute distress.    Appearance: She is obese.  Cardiovascular:     Rate and Rhythm: Normal rate and regular rhythm.  Pulmonary:     Effort: Pulmonary effort is normal.     Breath sounds: Normal breath sounds.  Abdominal:     Palpations: Abdomen is soft.     Tenderness: There is no abdominal tenderness.  Neurological:     General: No focal deficit present.     Mental Status: She is alert and oriented to person, place, and time.         Assessment & Plan:   Encounter for weight management  Class 1 obesity due to excess calories with serious comorbidity and body mass index (BMI) of 34.0 to 34.9 in adult  Primary hypertension  Other orders -     Phentermine HCl; Take 1 tablet (37.5 mg total) by mouth daily before breakfast.  Dispense: 30 tablet; Refill: 0     No follow-ups on file.   Tommie Raymond, MD

## 2023-07-19 ENCOUNTER — Ambulatory Visit: Admitting: Family Medicine

## 2023-07-19 ENCOUNTER — Encounter: Payer: Self-pay | Admitting: Family Medicine

## 2023-07-19 VITALS — BP 121/82 | HR 78 | Temp 98.1°F | Resp 16 | Ht 62.0 in | Wt 185.0 lb

## 2023-07-19 DIAGNOSIS — Z7689 Persons encountering health services in other specified circumstances: Secondary | ICD-10-CM

## 2023-07-19 DIAGNOSIS — E66811 Obesity, class 1: Secondary | ICD-10-CM | POA: Diagnosis not present

## 2023-07-19 DIAGNOSIS — Z6833 Body mass index (BMI) 33.0-33.9, adult: Secondary | ICD-10-CM

## 2023-07-19 DIAGNOSIS — E6609 Other obesity due to excess calories: Secondary | ICD-10-CM | POA: Diagnosis not present

## 2023-07-19 MED ORDER — PHENTERMINE HCL 37.5 MG PO TABS: 37.5000 mg | ORAL_TABLET | Freq: Every day | ORAL | 0 refills | Status: DC

## 2023-07-19 NOTE — Progress Notes (Signed)
 Established Patient Office Visit  Subjective    Patient ID: Laura Hunter, female    DOB: 1980-11-16  Age: 43 y.o. MRN: [id number]  CC: No chief complaint on file.   HPI Laura Hunter presents for routine weight management. Patient has not acute complaints.   Outpatient Encounter Medications as of 07/19/2023  Medication Sig   amLODipine  (NORVASC ) 10 MG tablet Take 1 tablet (10 mg total) by mouth daily.   ferrous sulfate  (FEROSUL) 325 (65 FE) MG tablet TAKE 1 TABLET(325 MG) BY MOUTH DAILY WITH BREAKFAST   hydrochlorothiazide  (HYDRODIURIL ) 12.5 MG tablet Take 1 tablet (12.5 mg total) by mouth daily.   phentermine  (ADIPEX-P ) 37.5 MG tablet Take 1 tablet (37.5 mg total) by mouth daily before breakfast.   [DISCONTINUED] phentermine  (ADIPEX-P ) 37.5 MG tablet Take 1 tablet (37.5 mg total) by mouth daily before breakfast.   No facility-administered encounter medications on file as of 07/19/2023.    Past Medical History:  Diagnosis Date   Essential hypertension    History of abnormal cervical Pap smear    History of bariatric surgery    History of diabetes mellitus resolved following bariatric surgery    Obesity    Pancreatitis     Past Surgical History:  Procedure Laterality Date   BLADDER REPAIR W/ CESAREAN SECTION     CERVICAL CONE BIOPSY     CESAREAN SECTION     COLONOSCOPY WITH PROPOFOL  N/A 04/29/2019   Procedure: COLONOSCOPY WITH PROPOFOL ;  Surgeon: Asencion Blacksmith, MD;  Location: [facility] ENDOSCOPY;  Service: Gastroenterology;  Laterality: N/A;   ESOPHAGOGASTRODUODENOSCOPY (EGD) WITH PROPOFOL  N/A 04/29/2019   Procedure: ESOPHAGOGASTRODUODENOSCOPY (EGD) WITH PROPOFOL ;  Surgeon: Asencion Blacksmith, MD;  Location: [facility] ENDOSCOPY;  Service: Gastroenterology;  Laterality: N/A;   HOT HEMOSTASIS N/A 04/29/2019   Procedure: HOT HEMOSTASIS (ARGON PLASMA COAGULATION/BICAP);  Surgeon: Asencion Blacksmith, MD;  Location: [facility] ENDOSCOPY;  Service: Gastroenterology;  Laterality: N/A;   ROUX-EN-Y  GASTRIC BYPASS      Family History  Problem Relation Age of Onset   HIV/AIDS Mother    Heart attack Mother    Drug abuse Mother    HIV/AIDS Father    Drug abuse Father    Diabetes Brother    Diabetes Maternal Grandmother    Heart attack Maternal Grandfather     Social History   Socioeconomic History   Marital status: Single    Spouse name: Not on file   Number of children: Not on file   Years of education: Not on file   Highest education level: Associate degree: academic program  Occupational History   Not on file  Tobacco Use   Smoking status: Former    Passive exposure: Never   Smokeless tobacco: Never  Vaping Use   Vaping status: Never Used  Substance and Sexual Activity   Alcohol use: Yes    Alcohol/week: 6.0 standard drinks of alcohol    Types: 6 Shots of liquor per week    Comment: 3 to 6 shots daily of Bacardi   Drug use: Never   Sexual activity: Yes    Partners: Male    Birth control/protection: I.U.D.  Other Topics Concern   Not on file  Social History Narrative   Not on file   Social Drivers of Health   Financial Resource Strain: Low Risk  (05/15/2023)   Overall Financial Resource Strain (CARDIA)    Difficulty of Paying Living Expenses: Not hard at all  Food Insecurity: No Food Insecurity (05/15/2023)   Hunger  Vital Sign    Worried About Programme researcher, broadcasting/film/video in the Last Year: Never true    Ran Out of Food in the Last Year: Never true  Transportation Needs: No Transportation Needs (05/15/2023)   PRAPARE - Administrator, Civil Service (Medical): No    Lack of Transportation (Non-Medical): No  Physical Activity: Insufficiently Active (05/15/2023)   Exercise Vital Sign    Days of Exercise per Week: 3 days    Minutes of Exercise per Session: 30 min  Stress: Stress Concern Present (05/15/2023)   [employer] of Occupational Health - Occupational Stress Questionnaire    Feeling of Stress : To some extent  Social Connections: Unknown  (05/15/2023)   Social Connection and Isolation Panel [NHANES]    Frequency of Communication with Friends and Family: More than three times a week    Frequency of Social Gatherings with Friends and Family: Twice a week    Attends Religious Services: More than 4 times per year    Active Member of [employer] or Organizations: Yes    Attends Banker Meetings: More than 4 times per year    Marital Status: Patient declined  Intimate Partner Violence: Not At Risk (05/19/2023)   Humiliation, Afraid, Rape, and Kick questionnaire    Fear of Current or Ex-Partner: No    Emotionally Abused: No    Physically Abused: No    Sexually Abused: No    Review of Systems  All other systems reviewed and are negative.       Objective    BP 121/82   Pulse 78   Temp 98.1 F (36.7 C) (Oral)   Resp 16   Ht 5\' 2"  (1.575 m)   Wt 185 lb (83.9 kg)   SpO2 99%   BMI 33.84 kg/m   Physical Exam Vitals and nursing note reviewed.  Constitutional:      General: She is not in acute distress.    Appearance: She is obese.  Cardiovascular:     Rate and Rhythm: Normal rate and regular rhythm.  Pulmonary:     Effort: Pulmonary effort is normal.     Breath sounds: Normal breath sounds.  Abdominal:     Palpations: Abdomen is soft.     Tenderness: There is no abdominal tenderness.  Neurological:     General: No focal deficit present.     Mental Status: She is alert and oriented to person, place, and time.         Assessment & Plan:   Encounter for weight management  Class 1 obesity due to excess calories with serious comorbidity and body mass index (BMI) of 33.0 to 33.9 in adult  Other orders -     Phentermine  HCl; Take 1 tablet (37.5 mg total) by mouth daily before breakfast.  Dispense: 30 tablet; Refill: 0     No follow-ups on file.   Arlo Lama, MD

## 2023-07-19 NOTE — Progress Notes (Signed)
Patient came in for monthly weight check. Patient has no other concerns today

## 2023-08-18 ENCOUNTER — Ambulatory Visit: Admitting: Family Medicine

## 2023-08-24 ENCOUNTER — Encounter: Payer: Self-pay | Admitting: Physician Assistant

## 2023-08-24 ENCOUNTER — Ambulatory Visit (INDEPENDENT_AMBULATORY_CARE_PROVIDER_SITE_OTHER): Admitting: Physician Assistant

## 2023-08-24 VITALS — BP 126/85 | HR 76 | Wt 187.8 lb

## 2023-08-24 DIAGNOSIS — Z6834 Body mass index (BMI) 34.0-34.9, adult: Secondary | ICD-10-CM | POA: Diagnosis not present

## 2023-08-24 DIAGNOSIS — E669 Obesity, unspecified: Secondary | ICD-10-CM | POA: Diagnosis not present

## 2023-08-24 DIAGNOSIS — Z7689 Persons encountering health services in other specified circumstances: Secondary | ICD-10-CM

## 2023-08-24 MED ORDER — PHENTERMINE HCL 37.5 MG PO TABS: 37.5000 mg | ORAL_TABLET | Freq: Every day | ORAL | 0 refills | Status: AC

## 2023-08-24 NOTE — Progress Notes (Signed)
 Patient ID: Laura Hunter, female   DOB: 1980-12-16, 43 y.o.   MRN: [id number]   Laura Hunter, is a 43 y.o. female  [id number]  [id number]  DOB - 23-Mar-1980  Chief Complaint  Patient presents with   Weight Management Screening       Subjective:   Laura Hunter is a 43 y.o. female here today for a follow up visit for phentermine  and weight loss.  She does feel the medication is working to decrease her appetite but sh has gotten off track and has been eating late at night.  She has also not been eating the right things.  She can not afford to go to weight watchers.  She wants to try on the phentermine  a little longer.    No problems updated.  ALLERGIES: Allergies  Allergen Reactions   Latex Other (See Comments)   Lisinopril Cough and Other (See Comments)    COUGH    PAST MEDICAL HISTORY: Past Medical History:  Diagnosis Date   Essential hypertension    History of abnormal cervical Pap smear    History of bariatric surgery    History of diabetes mellitus resolved following bariatric surgery    Obesity    Pancreatitis     MEDICATIONS AT HOME: Prior to Admission medications   Medication Sig Start Date End Date Taking? Authorizing Provider  amLODipine  (NORVASC ) 10 MG tablet Take 1 tablet (10 mg total) by mouth daily. 05/19/23  Yes Abraham Abo, MD  ferrous sulfate  (FEROSUL) 325 (65 FE) MG tablet TAKE 1 TABLET(325 MG) BY MOUTH DAILY WITH BREAKFAST 05/18/22  Yes Rogerio Clay, Amy J, NP  hydrochlorothiazide  (HYDRODIURIL ) 12.5 MG tablet Take 1 tablet (12.5 mg total) by mouth daily. 05/19/23  Yes Abraham Abo, MD  phentermine  (ADIPEX-P ) 37.5 MG tablet Take 1 tablet (37.5 mg total) by mouth daily before breakfast. 08/24/23   Laura Hunter, Stan Eans, PA-C    ROS: Neg HEENT Neg resp Neg cardiac Neg GI Neg GU Neg MS Neg psych Neg neuro  Objective:   Vitals:   08/24/23 1531  BP: 126/85  Pulse: 76  SpO2: 96%  Weight: 187 lb 12.8 oz (85.2 kg)   Exam General  appearance : Awake, alert, not in any distress. Speech Clear. Not toxic looking HEENT: Atraumatic and Normocephalic Skin: No Rash  Data Review Lab Results  Component Value Date   HGBA1C 5.9 (A) 02/22/2022    Assessment & Plan   1. Encounter for weight management (Primary) Weight loss counseling, increase activity, attend OA online or in person for peer support(free and immediately available)  Have a plan.  Eat protein at each meal - phentermine  (ADIPEX-P ) 37.5 MG tablet; Take 1 tablet (37.5 mg total) by mouth daily before breakfast.  Dispense: 30 tablet; Refill: 0    Return in about 4 weeks (around 09/21/2023) for PCP for chronic conditions-weight management.  The patient was given clear instructions to go to ER or return to medical center if symptoms don't improve, worsen or new problems develop. The patient verbalized understanding. The patient was told to call to get lab results if they haven't heard anything in the next week.      Dulce Gibbs, PA-C [facility] [place], [place] [phone]   08/24/2023, 3:51 PM

## 2023-08-24 NOTE — Patient Instructions (Signed)
 Overeaters anonymous website is [email or web address] many useful tips, tricks, meetings and support

## 2023-09-25 ENCOUNTER — Ambulatory Visit: Admitting: Family Medicine

## 2023-10-17 ENCOUNTER — Ambulatory Visit: Admitting: Family Medicine

## 2023-11-10 ENCOUNTER — Ambulatory Visit

## 2023-11-18 ENCOUNTER — Other Ambulatory Visit: Payer: Self-pay | Admitting: Family Medicine

## 2023-11-18 DIAGNOSIS — I10 Essential (primary) hypertension: Secondary | ICD-10-CM

## 2023-11-20 NOTE — Telephone Encounter (Signed)
 Requested Prescriptions  Pending Prescriptions Disp Refills   hydrochlorothiazide  (HYDRODIURIL ) 12.5 MG tablet [Pharmacy Med Name: HYDROCHLOROTHIAZIDE  12.5MG  TABLETS] 90 tablet 0    Sig: TAKE 1 TABLET(12.5 MG) BY MOUTH DAILY     Cardiovascular: Diuretics - Thiazide Failed - 11/20/2023 10:41 AM      Failed - Cr in normal range and within 180 days    Creatinine, Ser  Date Value Ref Range Status  02/22/2022 0.60 0.57 - 1.00 mg/dL Final         Failed - K in normal range and within 180 days    Potassium  Date Value Ref Range Status  02/22/2022 4.5 3.5 - 5.2 mmol/L Final         Failed - Na in normal range and within 180 days    Sodium  Date Value Ref Range Status  02/22/2022 141 134 - 144 mmol/L Final         Passed - Last BP in normal range    BP Readings from Last 1 Encounters:  08/24/23 126/85         Passed - Valid encounter within last 6 months    Recent Outpatient Visits           2 months ago Encounter for weight management   [place] [facility], Jon HERO, PA-C   4 months ago Encounter for [employer] management   [place] [facility], MD   5 months ago Encounter for weight management   [place] [facility], MD   6 months ago Primary hypertension   [place] [facility], MD   1 year ago Primary hypertension   [place] [facility], Amy J, NP               amLODipine  (NORVASC ) 10 MG tablet [Pharmacy Med Name: AMLODIPINE  BESYLATE 10MG TABLETS] 90 tablet 0    Sig: TAKE 1 TABLET(10 MG) BY MOUTH DAILY     Cardiovascular: Calcium Channel Blockers 2 Passed - 11/20/2023 10:41 AM      Passed - Last BP in normal range    BP Readings from Last 1 Encounters:  08/24/23 126/85         Passed - Last Heart Rate in normal range    Pulse Readings from Last 1 Encounters:  08/24/23 76         Passed - Valid  encounter within last 6 months    Recent Outpatient Visits           2 months ago Encounter for weight management   [place] [facility], Jon HERO, PA-C   4 months ago Encounter for [employer] management   [place] [facility], MD   5 months ago Encounter for weight management   [place] [facility], MD   6 months ago Primary hypertension   [place] [facility], MD   1 year ago Primary hypertension   [place] [facility], Greig PARAS, NP
